# Patient Record
Sex: Female | Born: 1945 | Race: White | Hispanic: No | Marital: Single | Smoking: Former smoker
Health system: Western US, Community
[De-identification: ages and names within clinical notes are randomized; demographics above are authoritative.]

---

## 2013-05-09 ENCOUNTER — Ambulatory Visit: Admit: 2013-05-09 | Discharge: 2013-05-09 | Attending: MD

## 2013-05-09 DIAGNOSIS — M542 Cervicalgia: Secondary | ICD-10-CM

## 2013-05-09 MED ORDER — tiotropium (SPIRIVA) 18 mcg inhalation
18 | ORAL_CAPSULE | Freq: Every day | RESPIRATORY_TRACT | 2.00 refills | 30.00000 days | Status: DC
Start: 2013-05-09 — End: 2013-06-28

## 2013-05-09 MED ORDER — piroxicam (FELDENE) 20 MG capsule
20 | ORAL_CAPSULE | Freq: Every day | ORAL | 1.00 refills | Status: DC
Start: 2013-05-09 — End: 2014-08-25

## 2013-05-09 NOTE — Progress Notes (Signed)
Pt and spouse spent 5 minutes watching the TENS unit DVD while I prepared all of the necessary paperwork for them to sign.    We then spent 10 minutes going over the TENS unit and how it works.  I had the patient sign the Sales Agreement and we were done.

## 2013-05-09 NOTE — Progress Notes (Signed)
Cheryl Lowe is a 68 y.o. female, DOB March 25, 1945, who presents today for Neck Pain; Depression; Fatigue; and Hair/Scalp Problem    Patient Active Problem List   Diagnosis SNOMED CT(R)   . Allergic rhinitis ALLERGIC RHINITIS   . Candidiasis of skin and nails CANDIDIASIS OF SKIN AND NAILS   . Carpal tunnel syndrome CARPAL TUNNEL SYNDROME   . Low back pain LOW BACK PAIN   . Cervicalgia NECK PAIN   . Chronic airway obstruction CHRONIC OBSTRUCTIVE LUNG DISEASE   . 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 Depressive disorder DEPRESSIVE DISORDER   . Family history of ischemic heart disease FAMILY HISTORY OF ISCHEMIC HEART DISEASE   . Hyperlipidemia HYPERLIPIDEMIA   . 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 Osteoarthrosis OSTEOARTHRITIS   . Vaginal prolapse VAGINAL WALL PROLAPSE   . 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  Spiriva and Piroxicam refilled.  Having a problem with results of the neck x-ray.  Went over in detail.  Neck is painful ever since person giving her a facial jerked her head/neck.  Used to have a TENS unit that worked well for her hips but gave it away.  Still having a lot of fatigue that she attributes to depression.  Did not do well with a prescription antidepressant due to side effects and asks about natural remedies.  Reviewed her medications.        REVIEW OF SYSTEMS:    Review of Systems   Constitutional: Positive for fatigue (attributes to depression). Negative for fever, chills, diaphoresis, activity change, appetite change and unexpected weight change.   HENT: Negative for congestion, facial swelling, nosebleeds, rhinorrhea and sore throat.    Eyes: Negative for photophobia, pain, discharge, redness, itching and visual disturbance.   Respiratory: Positive for cough and shortness of breath. Negative  for choking, chest tightness, wheezing and stridor.         Chronic and stable on current medications.   Cardiovascular: Negative for chest pain, palpitations and leg swelling.   Gastrointestinal: Negative for abdominal pain and abdominal distention.   Endocrine: Negative for cold intolerance, heat intolerance, polydipsia, polyphagia and polyuria.   Genitourinary: Negative for difficulty urinating.   Musculoskeletal: Positive for myalgias, back pain, arthralgias and neck pain. Negative for joint swelling and gait problem.   Skin: Negative for color change, pallor, rash and wound.        Itchy scalp.   Allergic/Immunologic: Positive for environmental allergies. Negative for food allergies and immunocompromised state.   Neurological: Negative for dizziness, weakness, light-headedness and headaches.   Hematological: Negative for adenopathy. Does not bruise/bleed easily.   Psychiatric/Behavioral: Positive for dysphoric mood (as above). Negative for suicidal ideas and sleep disturbance. The patient is nervous/anxious.         Allergies   Allergen Reactions   . Bupropion Other (See Comments)     Teeth clinching and tremors   . Cefuroxime Axetil Swelling   . Cephalexin Unspecified/Patient unsure   . Penicillins Swelling       Outpatient Prescriptions Marked as Taking for the 05/09/13 encounter (Office Visit) with Earline Mayotte, MD   Medication Sig Dispense Refill   . albuterol (ACCUNEB) 0.63 mg/3 mL nebulizer solution Take 3 mL by nebulization every 6 (six) hours as needed for wheezing.       Marland Kitchen  albuterol (PROAIR HFA) 90 mcg/actuation inhaler Inhale 2 puffs into the lungs every 4 to 6 (four to six) hours as needed for shortness of breath or wheezing.       . cetirizine (ZYRTEC) 10 MG tablet Take 10 mg by mouth daily.       . clotrimazole-betamethasone (LOTRISONE) lotion Apply  topically 2 (two) times daily.       . fluticasone (FLONASE) 50 mcg/actuation nasal spray Place 1 Spray into the nostril(s) 2 (two) times daily.        . inhalational spacing device (MICROSPACER) Spcr USE AS DIRECTED WITH ALBUTEROL EVERY 4 HOURS AS NEEDED.       Marland Kitchen piroxicam (FELDENE) 20 MG capsule Take 1 capsule by mouth once daily.       . ranitidine (ZANTAC) 150 MG tablet Take 150 mg by mouth nightly.       . simvastatin (ZOCOR) 20 MG tablet Take 1 Tab by mouth once daily.       Marland Kitchen tiotropium (SPIRIVA WITH HANDIHALER) 18 mcg inhalation Inhale 1 Cap into the lungs once daily. (Two inhalations of the contents of 1 capsule once daily.)           PHYSICAL EXAM:    Filed Vitals:    05/09/13 1407   BP: 157/90   Pulse: 80   Temp: 36.4 ?C (97.6 ?F)   SpO2: 93%    Body mass index is 0.00 kg/(m^2).  Physical Exam   Constitutional: She is oriented to person, place, and time. She appears well-developed and well-nourished. No distress.   HENT:   Head: Normocephalic and atraumatic.   Musculoskeletal:        Cervical back: She exhibits decreased range of motion, tenderness and spasm (moderate paraspinous spasms and tenderness). She exhibits no deformity.   Neurological: She is alert and oriented to person, place, and time. No cranial nerve deficit.   Skin: Skin is warm and dry. No rash noted. She is not diaphoretic. No erythema. No pallor.   No rash or erythema of scalp.   Psychiatric: She has a normal mood and affect. Her behavior is normal. Thought content normal.   Nursing note and vitals reviewed.        ASSESSMENT & PLAN:  1. Cervicalgia and 2. Cervical paraspinal muscle spasm, pain is not well controlled with current medication  - Continue piroxicam  - Dispensed TENS  - Offered PT and she will think about it, but she is very concerned about having someone manipulate her neck in a way that causes more problems    3. Depressive disorder  - Recommended Vitamin D3 5000 IU daily and B complex vitamins  - She is using her mood lights but not on a regular basis.  Fortunately, the days are getting longer and the seasonal component should be improving    4. Chronic airway  obstruction, stable  - Continue current medications and inhalers    5. Scalp itch  - Given a handout on rotating shampoos  - She will let us know if not improved and we can consider Desonide lotion.    See patient instructions.      Return in about 6 weeks (around 06/20/2013) for follow up TENS, mood, energy.

## 2013-05-09 NOTE — Patient Instructions (Addendum)
Continue your current medications including the topical Salonpas.    Add in a B complex vitamin and vitamin D3 5000 IU daily.      Let me know if your scalp continues to itch and we will give you a prescription for Desonide lotion.

## 2013-05-09 NOTE — Progress Notes (Signed)
Room #4    3 month follow up. JLT

## 2013-06-28 ENCOUNTER — Ambulatory Visit: Admit: 2013-06-28 | Discharge: 2013-06-28 | Attending: MD

## 2013-06-28 DIAGNOSIS — M542 Cervicalgia: Secondary | ICD-10-CM

## 2013-06-28 MED ORDER — clotrimazole-betamethasone (LOTRISONE) lotion
1-0.05 | TOPICAL | 0.00 refills | 15.00000 days | Status: DC
Start: 2013-06-28 — End: 2014-12-25

## 2013-06-28 MED ORDER — albuterol (PROAIR HFA) 90 mcg/actuation inhaler
90 | RESPIRATORY_TRACT | Status: DC
Start: 2013-06-28 — End: 2014-04-05

## 2013-06-28 NOTE — Progress Notes (Signed)
Patient is here for a 6 week follow up to discuss tens, mood, and energy. Patient needs medication refills. Room 3. KC

## 2013-06-28 NOTE — Progress Notes (Addendum)
Cheryl Lowe is a 68 y.o. female, DOB May 11, 1945, who presents today for Follow-up    Chief Complaint   Patient presents with   . Follow-up     TENS, depression       Patient Active Problem List   Diagnosis SNOMED CT(R)   . Allergic rhinitis ALLERGIC RHINITIS   . Candidiasis of skin and nails CANDIDIASIS OF SKIN AND NAILS   . Carpal tunnel syndrome CARPAL TUNNEL SYNDROME   . Low back pain LOW BACK PAIN   . Cervicalgia NECK PAIN   . Chronic airway obstruction CHRONIC OBSTRUCTIVE LUNG DISEASE   . 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 Depressive disorder DEPRESSIVE DISORDER   . Family history of ischemic heart disease FAMILY HISTORY OF ISCHEMIC HEART DISEASE   . Hyperlipidemia HYPERLIPIDEMIA   . 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 Vaginal prolapse VAGINAL WALL PROLAPSE   . OA (osteoarthritis) OSTEOARTHRITIS   . Trochanteric bursitis of both hips TROCHANTERIC BURSITIS         History of Present Illness  HPI Comments: The TENS is phenomenal.  She is using it daily and it is helping her pain quite a bit. She is using all four electrodes and places it on the posterior neck region and across the top of her shoulders.  She is also the TENS in the lower back area on occasion.  Overall, she is using the TENS 20-30 minutes each session, 2-3 times a day.      Mood is about the same and tired a lot, vitamins haven't really perked her up (B complex with C and vitamin D3 5000 IU).      Doesn't get in to see Uro-Gyn until end of the month and is very uncomfortable due to vaginal prolapse.  No obstruction but her urinary incontinence is worse.        Review of Systems   Constitutional: Negative for fever, chills, diaphoresis, activity change, appetite change, fatigue and unexpected weight change.   Gastrointestinal: Negative for nausea, vomiting, abdominal pain, diarrhea, constipation, blood in stool, abdominal distention, anal bleeding and rectal pain.   Genitourinary:  Positive for pelvic pain (feels heavy and uncomfortable). Negative for dysuria, frequency, hematuria, flank pain and difficulty urinating.        Incontinence due to prolapse.   Musculoskeletal: Positive for myalgias, back pain, arthralgias, neck pain and neck stiffness. Negative for joint swelling and gait problem.        Improved with TENS.   Psychiatric/Behavioral: Positive for dysphoric mood (as above, feels there is a component from her urinary/bladder symptoms). Negative for suicidal ideas, hallucinations, behavioral problems, confusion, sleep disturbance, self-injury, decreased concentration and agitation. The patient is not nervous/anxious and is not hyperactive.         Allergies   Allergen Reactions   . Cephalexin Anaphylaxis   . Penicillins Anaphylaxis   . Bupropion Other (See Comments)     Teeth clinching and tremors   . Cefuroxime Axetil Swelling       Outpatient Prescriptions Marked as Taking for the 06/28/13 encounter (Office Visit) with Earline Mayotte, MD   Medication Sig Dispense Refill   . albuterol (ACCUNEB) 0.63 mg/3 mL nebulizer solution Take 3 mL by nebulization every 6 (six) hours as needed for wheezing.     Marland Kitchen albuterol (PROAIR HFA) 90 mcg/actuation inhaler Inhale 2 puffs into the lungs every 4 to 6 (four to six) hours as needed for shortness of breath  or wheezing. 17 g 3   . ASCORBIC ACID/VITAMIN E/BIOTIN (HAIR,SKIN & NAILS WITH BIOTIN ORAL) Take by mouth daily.     . cetirizine (ZYRTEC) 10 MG tablet Take 10 mg by mouth daily as needed.      . cholecalciferol, vitamin D3, (VITAMIN D3) 5,000 unit Tab Take by mouth daily.     . clotrimazole-betamethasone (LOTRISONE) lotion Apply  topically 2 (two) times daily. 30 mL 3   . fluticasone (FLONASE) 50 mcg/actuation nasal spray Place 1 Spray into the nostril(s) 2 (two) times daily.     . inhalational spacing device (MICROSPACER) Spcr USE AS DIRECTED WITH ALBUTEROL EVERY 4 HOURS AS NEEDED.     Marland Kitchen piroxicam (FELDENE) 20 MG capsule Take 1 capsule (20 mg  total) by mouth daily. 90 capsule 3   . ranitidine (ZANTAC) 150 MG tablet Take 150 mg by mouth nightly as needed.      . simvastatin (ZOCOR) 20 MG tablet Take 1 Tab by mouth once daily.     Marland Kitchen tiotropium (SPIRIVA WITH HANDIHALER) 18 mcg inhalation Inhale 1 Cap into the lungs once daily. (Two inhalations of the contents of 1 capsule once daily.)     . VITAMIN B COMPLEX & VIT C NO.4 (SUPER B COMPLEX + C ORAL) Take by mouth daily.     . [DISCONTINUED] albuterol (PROAIR HFA) 90 mcg/actuation inhaler Inhale 2 puffs into the lungs every 4 to 6 (four to six) hours as needed for shortness of breath or wheezing.     . [DISCONTINUED] clotrimazole-betamethasone (LOTRISONE) lotion Apply  topically 2 (two) times daily.         Filed Vitals:    06/28/13 1306   BP: 141/71   Pulse: 72   Temp: 36.5 ?C (97.7 ?F)   SpO2: 96%    Body mass index is 38.48 kg/(m^2).  Physical Exam   Constitutional: She is oriented to person, place, and time. She appears well-developed and well-nourished. No distress.   Neurological: She is alert and oriented to person, place, and time. No cranial nerve deficit.   Skin: She is not diaphoretic.   Psychiatric: She has a normal mood and affect. Her behavior is normal. Judgment and thought content normal.   Nursing note and vitals reviewed.        ASSESSMENT & PLAN:    1. Cervicalgia, 2. Spasm of muscle and underlying 3. Primary osteoarthritis involving multiple joints  - She will continue the TENS and current medications    4. Depressive disorder, worse in setting of symptoms from vaginal prolapse with urinary incontinence  - Continue current medications and supplements  - She is also working with hospice and their support groups as she has lost several family members and friends in the past year and is struggling with grief.    5. Vaginal prolapse  - Follow up with Uro-Gyn as scheduled.    See patient instructions.    Return in about 3 months (around 09/28/2013) for CPE, breast exam, Prevnar.

## 2013-09-27 ENCOUNTER — Ambulatory Visit: Admit: 2013-09-27 | Discharge: 2013-09-27 | Attending: MD

## 2013-09-27 DIAGNOSIS — Z23 Encounter for immunization: Secondary | ICD-10-CM

## 2013-09-27 MED ORDER — pneumococcal conjugated vaccine (PF) (PREVNAR PCV13) vaccine 0.5 mL
0.5 | Freq: Once | INTRAMUSCULAR | Status: AC
Start: 2013-09-27 — End: 2013-09-27
  Administered 2013-09-27: 22:00:00 0.5 mL via INTRAMUSCULAR

## 2013-09-27 NOTE — Progress Notes (Signed)
Cheryl Lowe is a 68 y.o. female, DOB 11-27-1945, who presents today for Follow-up; Hospital F/U; and Breast Examination      Chief Complaint   Patient presents with   . Follow-up     Chronic conditions   . Hospital F/U   . Breast Examination       Patient Active Problem List   Diagnosis SNOMED CT(R)   . Allergic rhinosinusitis ALLERGIC RHINITIS   . Candidiasis, cutaneous CANDIDIASIS OF SKIN   . Carpal tunnel syndrome, bilateral CARPAL TUNNEL SYNDROME   . Chronic low back pain CHRONIC LOW BACK PAIN   . Chronic neck pain CHRONIC NECK PAIN   . COPD (chronic obstructive pulmonary disease) CHRONIC OBSTRUCTIVE LUNG DISEASE   . DDD (degenerative disc disease) DEGENERATION OF INTERVERTEBRAL DISC   . Depression DEPRESSIVE DISORDER   . Family history of early CAD FAMILY HISTORY OF CORONARY ARTERIOSCLEROSIS   . HLD (hyperlipidemia) HYPERLIPIDEMIA   . Mixed incontinence MIXED INCONTINENCE   . 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 OA (osteoarthritis) OSTEOARTHRITIS   . Trochanteric bursitis of both hips TROCHANTERIC BURSITIS           History of Present Illness  HPI Comments: Cheryl Lowe comes in for follow up of her chronic medical conditions and a breast exam.  She has no concerns regarding her breast health.  Mammogram every two years unless abnormal exam, not due until next year.  Still recovering from vaginal prolapse surgery but doing well and as expected.   We reviewed and updated past medical history, social history, family history, review of systems and medications with changes as noted below.    Her chronic cough has significantly improved and she is congratulated for staying off cigarettes.         Review of Systems   Constitutional: Positive for fatigue (attributes to depression). Negative for fever, chills, diaphoresis, activity change, appetite change and unexpected weight change.   HENT: Negative for congestion, facial swelling, nosebleeds, rhinorrhea and sore throat.    Eyes: Negative for photophobia,  pain, discharge, redness, itching and visual disturbance.   Respiratory: Positive for cough and shortness of breath. Negative for choking, chest tightness, wheezing and stridor.         Chronic and stable on current medications.   Cardiovascular: Negative for chest pain, palpitations and leg swelling.   Gastrointestinal: Negative for nausea, vomiting, abdominal pain, diarrhea, constipation, blood in stool, abdominal distention, anal bleeding and rectal pain.   Endocrine: Negative for cold intolerance, heat intolerance, polydipsia, polyphagia and polyuria.   Genitourinary: Negative for dysuria and pelvic pain.        Recent surgery and is still recovering.  She continues to have some leakage of urine but is much improved from prior to her surgery.   Musculoskeletal: Positive for myalgias, back pain, arthralgias and neck pain. Negative for joint swelling and gait problem.   Skin: Negative for color change, pallor, rash and wound.        Itchy scalp.   Allergic/Immunologic: Positive for environmental allergies. Negative for food allergies and immunocompromised state.   Neurological: Negative for dizziness, tremors, seizures, syncope, facial asymmetry, speech difficulty, weakness, light-headedness, numbness and headaches.   Hematological: Negative for adenopathy. Does not bruise/bleed easily.   Psychiatric/Behavioral: Positive for dysphoric mood (improved). Negative for suicidal ideas, hallucinations, behavioral problems, confusion, sleep disturbance, self-injury, decreased concentration and agitation. The patient is nervous/anxious. The patient is not hyperactive.         Past Medical  History   Diagnosis Date   . Trochanteric bursitis of both hips    . Allergic rhinosinusitis    . Depression    . HLD (hyperlipidemia)    . Tobacco abuse    . Obesity (BMI 30.0-34.9)    . Bilateral carpal tunnel syndrome    . Leaking of urine    . COPD (chronic obstructive pulmonary disease)    . Mixed incontinence    . Lumbago      2/2  DDD   . Chronic neck pain      2/2 DDD   . OA (osteoarthritis)        Past Surgical History   Procedure Laterality Date   . Total abdominal hysterectomy  1974     Retained ovaries, performed due to prolapse   . Repair rectocele  1998   . Cystocele repair  1998   . Shoulder surgery Right 1982     Removal of calcium deposits   . Cervical laminectomy  2005     [place], [place]   . Cervical fusion  2005     [place], [place]   . Sacropexy and urethal sling  08/05/2013     [place]       History     Social History   . Marital Status: Single     Spouse Name: N/A     Number of Children: 2   . Years of Education: N/A     Occupational History   . Psychiatrist - Retired      Formerly worked for [employer] History Main Topics   . Smoking status: Former Smoker -- 1.00 packs/day for 45 years     Types: Cigarettes     Quit date: 01/20/2012   . Smokeless tobacco: Never Used      Comment: Trying to quit   . Alcohol Use: 2.4 - 4.8 oz/week     4-8 Glasses of wine per week      Comment: 1-2 glasses red wine four nights a week   . Drug Use: No   . Sexual Activity: No     Other Topics Concern   . Not on file     Social History Narrative       Family History   Problem Relation Age of Onset   . High Cholestrol Mother    . Hypertension Mother    . Hypothyroidism Mother    . Heart attack Father 25   . Bone cancer Maternal Uncle    . Cancer Maternal Aunt      Face and neck   . Stroke Father      Cerebral hemorrhage   . Leukemia Sister    . Alcohol abuse Father      /Drug abuse   . Diabetes Brother    . Diabetes Maternal Uncle    . Hypertension Brother    . Hypertension Father    . High Cholestrol Father    . High Cholestrol Brother        Family Status   Relation Status Death Age   . Father Deceased 88     Cerebral hemorrhage   . Mother Alive    . Sister Deceased 64     Leukemia   . Brother Alive    . Sister Alive    . Son Alive    . Son Alive        Allergies   Allergen Reactions   . Keflex [Cephalexin]  Anaphylaxis   .  Penicillins Anaphylaxis and Swelling   . Ceftin [Cefuroxime Axetil] Swelling   . Wellbutrin [Bupropion Hcl] Other (See Comments)     Teeth clenching and tremors       Outpatient Prescriptions Marked as Taking for the 09/27/13 encounter (Office Visit) with Earline Mayotte, MD   Medication Sig Dispense Refill   . albuterol (ACCUNEB) 0.63 mg/3 mL nebulizer solution Take 3 mL by nebulization every 6 (six) hours as needed for wheezing.     Marland Kitchen albuterol (PROAIR HFA) 90 mcg/actuation inhaler Inhale 2 puffs into the lungs every 4 to 6 (four to six) hours as needed for shortness of breath or wheezing. 17 g 3   . cetirizine (ZYRTEC) 10 MG tablet Take 10 mg by mouth daily as needed.      . cholecalciferol, vitamin D3, (VITAMIN D3) 5,000 unit Tab Take by mouth daily.     . clotrimazole-betamethasone (LOTRISONE) lotion Apply  topically 2 (two) times daily. 30 mL 3   . fluticasone (FLONASE) 50 mcg/actuation nasal spray Place 1 Spray into the nostril(s) 2 (two) times daily.     . inhalational spacing device (MICROSPACER) Spcr USE AS DIRECTED WITH ALBUTEROL EVERY 4 HOURS AS NEEDED.     Marland Kitchen piroxicam (FELDENE) 20 MG capsule Take 1 capsule (20 mg total) by mouth daily. 90 capsule 3   . ranitidine (ZANTAC) 150 MG tablet Take 150 mg by mouth nightly as needed.      . simvastatin (ZOCOR) 20 MG tablet Take 1 Tab by mouth once daily.     Marland Kitchen tiotropium (SPIRIVA WITH HANDIHALER) 18 mcg inhalation Inhale 1 Cap into the lungs once daily. (Two inhalations of the contents of 1 capsule once daily.)     . VITAMIN B COMPLEX & VIT C NO.4 (SUPER B COMPLEX + C ORAL) Take by mouth daily.     . [DISCONTINUED] ASCORBIC ACID/VITAMIN E/BIOTIN (HAIR,SKIN & NAILS WITH BIOTIN ORAL) Take by mouth daily.         Filed Vitals:    09/27/13 1413   BP: 137/78   Pulse: 76   Temp: 36.6 ?C (97.9 ?F)   Resp: 20   SpO2: 95%    Body mass index is 36.77 kg/(m^2).  Physical Exam   Constitutional: She is oriented to person, place, and time. She appears well-developed and  well-nourished. No distress.   HENT:   Head: Normocephalic and atraumatic.   Mouth/Throat: Oropharynx is clear and moist. No oropharyngeal exudate.   Eyes: Conjunctivae and EOM are normal. Pupils are equal, round, and reactive to light. Right eye exhibits no discharge. Left eye exhibits no discharge. No scleral icterus.   Neck: Normal range of motion. Neck supple. No tracheal deviation present. No thyromegaly present.   Cardiovascular: Normal rate, regular rhythm, normal heart sounds and intact distal pulses.  Exam reveals no gallop and no friction rub.    No murmur heard.  Pulmonary/Chest: Effort normal and breath sounds normal. No stridor. No respiratory distress. She has no wheezes. She has no rales. Right breast exhibits no inverted nipple, no mass, no nipple discharge, no skin change and no tenderness. Left breast exhibits no inverted nipple, no mass, no nipple discharge, no skin change and no tenderness. Breasts are symmetrical.   Abdominal: Soft. Bowel sounds are normal. She exhibits distension (a little bloated, normal for post op status). There is tenderness (mild and diffuse). There is no rebound and no guarding.   Well healed sites of robotic surgery ports.  Musculoskeletal: Normal range of motion. She exhibits edema (trace). She exhibits no tenderness.   Lymphadenopathy:        Head (right side): No submental, no submandibular, no preauricular and no posterior auricular adenopathy present.        Head (left side): No submental, no submandibular, no preauricular and no posterior auricular adenopathy present.     She has no cervical adenopathy.     She has no axillary adenopathy.        Right axillary: No pectoral and no lateral adenopathy present.        Left axillary: No pectoral and no lateral adenopathy present.       Right: No supraclavicular adenopathy present.        Left: No supraclavicular adenopathy present.   Neurological: She is alert and oriented to person, place, and time. She has normal  reflexes. She displays normal reflexes. No cranial nerve deficit. She exhibits normal muscle tone. Coordination normal.   Skin: Skin is warm and dry. No rash noted. She is not diaphoretic. No erythema. No pallor.   Psychiatric: She has a normal mood and affect. Her behavior is normal. Judgment and thought content normal.   Nursing note and vitals reviewed.        ASSESSMENT & PLAN:    1. Vaginal prolapse and 2. Mixed incontinence, recovering well from robotic surgical repair  - She will follow up with Dr. Delbert Harness are scheduled    3. COPD (chronic obstructive pulmonary disease) with underlying 4. Allergic rhinitis due to animal hair and dander, significantly improved from last year  - Continue current medications and inhalers    5. Breast cancer screening, normal exam   - Mammogram next year    6. Need for pneumococcal vaccination  - Prevnar-13 given today    See patient instructions.    Return in about 6 months (around 03/28/2014) for follow up.    30 minutes of face to face time was spent with the patient, greater than half of this time was spent counseling the patient regarding post surgical course and management of her COPD.

## 2013-09-27 NOTE — Progress Notes (Signed)
CPE, Breast exam & Prevnar.    RM 3

## 2013-11-07 ENCOUNTER — Institutional Professional Consult (permissible substitution): Admit: 2013-11-07 | Discharge: 2013-11-07

## 2013-11-07 DIAGNOSIS — Z23 Encounter for immunization: Secondary | ICD-10-CM

## 2013-11-07 MED ORDER — flu vaccine qs 2015-16 (36mos+) (PF) 0.5 mL
60 | Freq: Once | INTRAMUSCULAR | Status: AC
Start: 2013-11-07 — End: 2013-11-07
  Administered 2013-11-07: 17:00:00 60 mL via INTRAMUSCULAR

## 2013-11-07 NOTE — Progress Notes (Signed)
Are you allergic to eggs?  No  Do you have a cold, fever, or acute illness?  No  Have you ever had Guilan-Barre syndrome?  No  Have you received a vaccine in the last 30 days?  No    Adverse reaction to this flu shot? No    Standing Paper Order on file as of 10/27/13, at [facility].   JLT

## 2013-11-07 NOTE — Telephone Encounter (Signed)
When pt was in for her flu shot this morning she stopped at my desk and said she's been having some eye issues.  Feels like her eye lids are impacting mostly overnight to her lower lids.  But she also said her eye sight has been very blurry lately and eyes are watering all the time.  Not sure if she needs to be seen or if there's something else she could try.

## 2013-11-08 NOTE — Telephone Encounter (Signed)
I would start with warm compresses at night time and ketotifen eye drops (sounds like eye allergies).  Do they burn or become red?

## 2013-11-08 NOTE — Telephone Encounter (Signed)
Left detailed message.   

## 2014-02-13 NOTE — Telephone Encounter (Signed)
Patient states that she hurt her neck right after Christmas and went to immediate care. They gave her muscle relaxers, but it is still bothering her. She would like to get an appointment with Dr. Annabelle Harman. bes

## 2014-02-13 NOTE — Telephone Encounter (Signed)
Lets get her in with Dennie Bible, Dr Annabelle Harman doesn't have anything right now.

## 2014-02-13 NOTE — Telephone Encounter (Signed)
I contacted the patient and have her scheduled to see Dennie Bible, FNP. SDD    Future Appointments  Date Time Provider Department Center   02/14/2014 11:10 AM Raymond Gurney, NP [facility]   03/29/2014 10:20 AM Earline Mayotte, MD [facility]

## 2014-02-14 ENCOUNTER — Ambulatory Visit: Admit: 2014-02-14 | Discharge: 2014-02-14 | Attending: Family

## 2014-02-14 DIAGNOSIS — M542 Cervicalgia: Secondary | ICD-10-CM

## 2014-02-14 MED ORDER — baclofen (LIORESAL) 10 MG tablet
10 | ORAL_TABLET | Freq: Three times a day (TID) | ORAL | 1.00 refills | 30.00000 days | Status: DC
Start: 2014-02-14 — End: 2014-02-14

## 2014-02-14 MED ORDER — baclofen (LIORESAL) 10 MG tablet
10 | ORAL_TABLET | ORAL | 1.00 refills | 30.00000 days | Status: DC
Start: 2014-02-14 — End: 2014-04-05

## 2014-02-14 NOTE — Patient Instructions (Addendum)
When in donut hole ask for patient assistance    Piroxicam until neck heals  Ice as anti-inflammatory if flares  Muscle relaxer to sleep    Ask for copy of neck x-ray done before this system in  Place    Lab Orders  Office use only:  Date: 02/14/2014  Time: 11:50 AM  User Initials: SDD     Additional Info:   Patient Information:  [x]  Fasting []  Non-fasting lab orders.  When?: Anytime    (Fast for 10-12 hours, may have water and usually medications only)    Referral Instructions (Pending)  Office use only:  Date: 02/14/2014  Time: 11:51 AM  User Initials: SDD     Additional Info:   Pending referral for Physical Therapy.    []  Please call to schedule an appointment.  []  We will call you with a scheduled appointment.   [x] They will call you to schedule an appointment.    [x]  We will send pertinent records  []  No records needed

## 2014-02-14 NOTE — Progress Notes (Signed)
Please see the patient instructions section of the chart.

## 2014-02-14 NOTE — Progress Notes (Signed)
Cheryl Lowe is a 69 y.o. female, DOB 1945/08/28, who presents today for Neck Pain  .      History of Present Illness  HPI Comments: said not taking meds but leave them on as she might want to start again  Started spireva for COPD  Thinks doing well without it  Never wheezes, albuterol only in summer  Just quit everything because in donut hole due to cost of spireva  Piroxicam didn't notice difference    Neck issue  Did a stupid thing she lifted acanning pot that was full and lifted up into frig it was that heavy didn't hurt until next day  Hurt down sides of neck can't turn and can't look down  Sleep painful  4 days after xmas  [facility] jan 1st just gave muscle relaxer because woke so swollen didn't have chin and or neck got antibiotics from sinus, couldn't blow nose antibiotics got rid of swelling and sinus but didn't change the pain  Muscle relaxer calmed things down then did serving commmuity soup 5 days later  Hasn't tried piroxicam or muscle relaxer again  Ice and heat releives for a moment  Has metal in neck got a facial and neck was twisted hard x-rays with cadaver and found has metal in neck still  Repaired C5     Hasn't had blood in long time  Not taking lipid meds  Wants check for vitamins etc too.   Depression doing OK without meds    Neck Pain            Review of Systems   Constitutional: Positive for activity change.        Neck hurts limits movements   HENT:        See HPI had swelling and sinus problem Jan 1 got antibiotics resolved now but neck still a bit tender to touch in the front   Respiratory: Positive for wheezing. Negative for apnea, cough, shortness of breath and stridor.         Denies resp problems, known COPD but meds didn't help and were expensive   Gastrointestinal:        On simvastatin but not taking lately   Musculoskeletal: Positive for neck pain.        See HPI   Allergic/Immunologic: Positive for environmental allergies.   Neurological: Negative.       Psychiatric/Behavioral:        Hx depression not on meds and doing OK          Filed Vitals:    02/14/14 1121   BP: 139/73   Pulse: 75   Resp: 12   SpO2: 93%    There is no weight on file to calculate BMI.  Physical Exam   Constitutional: She is oriented to person, place, and time. She appears well-developed and well-nourished. No distress.   HENT:   Head: Normocephalic.   Right Ear: External ear normal.   Left Ear: External ear normal.   Nose: Nose normal.   Mouth/Throat: Oropharynx is clear and moist. No oropharyngeal exudate.   Eyes: EOM are normal. Pupils are equal, round, and reactive to light.   Neck: Normal range of motion. Neck supple. No thyromegaly present.   Tender to palpation of thyroid and anterior neck muscles   Cardiovascular: Normal rate and regular rhythm.    Pulmonary/Chest: Effort normal and breath sounds normal. No respiratory distress. She has no wheezes.   Musculoskeletal: She exhibits tenderness.  Point tender over the SCM bilaterally and in the trapezius on right, and down the right side of spine.  Resisted pushing and can pour hurt right side of neck and back. Head ROM limited side to side about 30 degrees, chin down to 30 degrees.   Lymphadenopathy:     She has no cervical adenopathy.   Neurological: She is alert and oriented to person, place, and time. No cranial nerve deficit.   Skin: Skin is warm and dry.   Psychiatric: She has a normal mood and affect. Her behavior is normal. Judgment and thought content normal.   Pleasant jovial laughing   Nursing note and vitals reviewed.        ASSESSMENT & PLAN:       ICD-9-CM ICD-10-CM    1. Muscle spasms of head and/or neck 728.85 M62.48 Referral to Physical Medicine Rehab      baclofen (LIORESAL) 10 MG tablet      Comprehensive Metabolic Panel -Routine   2. Vitamin D deficiency disease 268.9 E55.9 25-Hydroxyvitamin D2 And D3, Serum -Routine   3. COPD (chronic obstructive pulmonary disease) 496 J44.9 Comprehensive Metabolic Panel -Routine       CBC with Auto Differential -Routine   4. HLD (hyperlipidemia) 272.4 E78.5 Comprehensive Metabolic Panel -Routine      Lipid Panel with Direct LDL -Routine   5. Depression 311 F32.9 Thyroid Stimulating Hormone -Routine     When in donut hole ask for patient assistance    Piroxicam until neck heals  Ice as anti-inflammatory if flares  Muscle relaxer to sleep    Ask for copy of neck x-ray done before this system in  Place

## 2014-02-16 NOTE — Progress Notes (Signed)
Agree with assessment and plan of care

## 2014-03-01 ENCOUNTER — Other Ambulatory Visit: Admit: 2014-03-01

## 2014-03-01 DIAGNOSIS — M62838 Other muscle spasm: Secondary | ICD-10-CM

## 2014-03-01 LAB — LIPID PANEL WITH DIRECT LDL
Chol/HDL Ratio: 3.7 (ref 0.0–5.0)
Cholesterol, HDL: 63 mg/dL (ref >=60)
Cholesterol: 235 mg/dL — ABNORMAL HIGH (ref <=200)
Direct LDL: 158 mg/dL — ABNORMAL HIGH (ref 0.0–129.0)
Triglyceride: 96 mg/dL (ref <=150.0)
VLDL Cholesterol Calculation: 19.2 mg/dL (ref 7.0–32.0)

## 2014-03-01 LAB — COMPREHENSIVE METABOLIC PANEL
ALT - Alanine Aminotransferase: 25 U/L (ref 5–33)
AST - Aspartate Aminotransferase: 21 U/L (ref 5–32)
Albumin/Globulin Ratio: 1.3 (ref 1.2–2.2)
Albumin: 4.1 g/dL (ref 3.5–5.0)
Alkaline Phosphatase: 70 U/L (ref 35–105)
Anion Gap: 19 mmol/L — ABNORMAL HIGH (ref 5.0–15.0)
BUN / Creatinine Ratio: 26.6 — ABNORMAL HIGH (ref 12.0–20.0)
BUN: 17 mg/dL (ref 8–20)
Bilirubin Total: 0.9 mg/dL (ref 0.10–1.70)
CO2 - Carbon Dioxide: 23 mmol/L (ref 22–32)
Calcium: 9.3 mg/dL (ref 8.6–10.6)
Chloride: 104.6 mmol/L (ref 101.0–111.0)
Creatinine: 0.64 mg/dL (ref 0.60–1.30)
Glomerular Filtration Rate Estimate: >60 mL/min/{1.73_m2} (ref >=60.0)
Glucose: 104 mg/dL (ref 74–106)
Osmolality Calculation: 285 mOsm/kg (ref 275.0–300.0)
Potassium: 4.21 mmol/L (ref 3.50–5.10)
Protein Total: 7.2 g/dL (ref 6.1–7.9)
Sodium: 142 mmol/L (ref 135–145)

## 2014-03-01 LAB — CBC WITH AUTO DIFFERENTIAL
Basophils %: 1 % (ref 0–2)
Basophils, Absolute: 0 10*3/??L (ref 0.0–0.1)
Eosinophils %: 4 % (ref 0–5)
Eosinophils, Absolute: 0.2 10*3/??L (ref 0.0–0.4)
HCT: 44.6 % (ref 35.0–48.0)
Hemoglobin: 14.8 g/dL (ref 11.7–16.5)
Lymphocytes %: 25 % (ref 20–40)
Lymphocytes, Absolute: 1.5 10*3/??L (ref 1.0–4.0)
MCH: 32.2 pg (ref 28.3–33.3)
MCHC: 33.2 g/dL (ref 32.5–36.0)
MCV: 97 fL (ref 81.0–100.0)
MPV: 8.4 fL (ref 6.9–10.0)
Monocytes %: 7 % (ref 2–12)
Monocytes, Absolute: 0.4 10*3/??L (ref 0.2–1.0)
Neutrophils %: 64 % (ref 50–74)
Neutrophils, Absolute: 3.8 10*3/??L (ref 2.0–7.4)
Platelet Count: 209 10*3/??L (ref 150–405)
RBC: 4.6 10*6/??L (ref 3.80–5.60)
RDW: 13.5 % (ref 11.7–16.1)
WBC: 5.9 10*3/??L (ref 4.50–11.00)

## 2014-03-01 LAB — TSH: TSH - Thyroid Stimulating Hormone: 1.5 u[IU]/mL (ref 0.34–5.60)

## 2014-03-01 LAB — 25-HYDROXYVITAMIN D, LC/MS/MS (MAYO)
25-Hydroxy D Total: 30 ng/mL
25-Hydroxy D2: <4 ng/mL
25-Hydroxy D3: 30 ng/mL

## 2014-03-01 NOTE — Telephone Encounter (Signed)
I'll prescribe a Medrol dose pack and Z-pack but she should only take if she has symptoms.

## 2014-03-01 NOTE — Telephone Encounter (Signed)
Pt came in to let us know that she will be taking a flight to see her sister on 03/11/2014.   Pt is requesting that Dr. Annabelle Harman prescribe her Prednisone and antibiotics as she has lung issues and the recycled air on the plane is hard on her bad lungs.  Advised I would get a note back with the request for further review.  Pt can be reached at [phone] if there are any additional questions.   BJG

## 2014-03-02 MED ORDER — methylPREDNISolone (MEDROL DOSEPACK) 4 mg tablet
4 | ORAL_TABLET | ORAL | Status: DC
Start: 2014-03-02 — End: 2014-10-11

## 2014-03-02 MED ORDER — azithromycin (ZITHROMAX) 250 MG tablet
250 | ORAL_TABLET | Freq: Every day | ORAL | 0.00 refills | 5.00000 days | Status: DC
Start: 2014-03-02 — End: 2014-10-11

## 2014-03-02 NOTE — Telephone Encounter (Signed)
Left message for pt that rx's were ordered as requested for trip.

## 2014-03-10 NOTE — Telephone Encounter (Signed)
From: [name]  To: Earline Mayotte, MD  Sent: 03/09/2014 5:23 PM PST  Subject: Non-Urgent Medical Question    HI guys!!! I'm trying to find out what blood type I am. Any idea where I can find this information in MyChart???? Or can you tell me......?    Thanks bunches,  L/B

## 2014-03-29 ENCOUNTER — Encounter: Attending: MD

## 2014-04-03 NOTE — Telephone Encounter (Signed)
Pt called, she is needing to reschedule the 03/29/14 appointment that she missed due to being out of town.  I advised I would get a note back to Dr. Marland Kitchen staff to review future availability.     BJG

## 2014-04-03 NOTE — Telephone Encounter (Signed)
I contacted the patient and have her rescheduled with Dr. Annabelle Harman. While on the phone with the patient she said that [facility] just called her to get her scheduled and the referral was sent to them in January. She would like to have a referral sent to someone else for her physical therapy. She said that it is pretty ridiculous that they are just now contacting her to schedule an appointment. SDD    Future Appointments  Date Time Provider Department Center   04/05/2014 1:40 PM Earline Mayotte, MD [facility]

## 2014-04-03 NOTE — Telephone Encounter (Signed)
Please call to r/s

## 2014-04-03 NOTE — Telephone Encounter (Signed)
Pt is coming in on the 9th, we can discuss PT referral then.

## 2014-04-05 ENCOUNTER — Ambulatory Visit: Admit: 2014-04-05 | Discharge: 2014-04-05 | Attending: MD

## 2014-04-05 ENCOUNTER — Inpatient Hospital Stay: Admit: 2014-04-05 | Attending: MD

## 2014-04-05 DIAGNOSIS — M5031 Other cervical disc degeneration,  high cervical region: Secondary | ICD-10-CM

## 2014-04-05 DIAGNOSIS — M62838 Other muscle spasm: Secondary | ICD-10-CM

## 2014-04-05 MED ORDER — albuterol (PROVENTIL HFA;VENTOLIN HFA) 90 mcg/actuation inhaler
90 | RESPIRATORY_TRACT | 2.00 refills | 17.00000 days | Status: DC | PRN
Start: 2014-04-05 — End: 2014-09-21

## 2014-04-05 MED ORDER — albuterol (ACCUNEB) 0.63 mg/3 mL nebulizer solution
0.63 | RESPIRATORY_TRACT | Status: DC | PRN
Start: 2014-04-05 — End: 2014-04-06

## 2014-04-05 NOTE — Patient Instructions (Addendum)
X-Ray  Office use only:  Date: 04/05/2014  Time: 2:34 PM  User Initials: SDD     Additional Info:     Scheduling Information:  No appointment needed. Please walk into [facility] or [facility] at your convenience.      Procedure Instructions:  No special instructions.    Referral Instructions (Pending)  Office use only:  Date: 04/05/2014  Time: 2:35 PM  User Initials: SDD     Additional Info:   Pending referral for Physical Therapy.    []  Please call to schedule an appointment.  []  We will call you with a scheduled appointment.   [x] They will call you to schedule an appointment.    [x]  We will send pertinent records  []  No records needed    REMINDER: PLEASE BRING THIS LIST OF MEDICATIONS WITH YOU TO YOUR NEXT APPOINTMENT.   Please review the medications on this sheet with the medications you have at home. If you find a discrepancy or there are changes between now and your next appointment please make note on the after visit summary.     As a reminder our office requires you to arrive at the time of or before your scheduled appointment, failure to due so may result in a reschedule of your appointment.         Script for Albuterol Faxed to [facility] on 04/05/14

## 2014-04-05 NOTE — Progress Notes (Signed)
Cheryl Lowe is a 69 y.o. female, DOB 03/19/1945, who presents today for No chief complaint on file.  Marland Kitchen      History of Present Illness  HPI Comments: No recent pulmonary symptoms, except did some plane travel recently and has a slight productive cough.  She continues her inhalers.  Having a lot of neck pain with popping and cracking sensation with movements.  Occasionally will radiate into arms but no significant weakness or paresthesias.  Chronic low back pain but seems to be worse lately because she is doing some lifting and packing.  Requests PT and this is certainly reasonable and has helped her in the past.  Planning to move to [place] when she sells her house.           Review of Systems   Constitutional: Negative for fever, chills, diaphoresis, activity change, appetite change, fatigue and unexpected weight change.   HENT: Negative for congestion and sinus pressure.    Respiratory: Positive for cough. Negative for apnea, choking, chest tightness, shortness of breath, wheezing and stridor.    Cardiovascular: Negative for chest pain and leg swelling.   Musculoskeletal: Positive for myalgias, back pain, arthralgias, neck pain and neck stiffness. Negative for joint swelling and gait problem.        See HPI   Allergic/Immunologic: Positive for environmental allergies.   Neurological: Negative.    Psychiatric/Behavioral:        Hx depression not on meds and doing OK        Allergies   Allergen Reactions   . Keflex [Cephalexin] Anaphylaxis   . Penicillins Anaphylaxis and Swelling   . Ceftin [Cefuroxime Axetil] Swelling   . Wellbutrin [Bupropion Hcl] Other (See Comments)     Teeth clenching and tremors       Outpatient Prescriptions Marked as Taking for the 04/05/14 encounter (Office Visit) with Earline Mayotte, MD   Medication Sig Dispense Refill   . cetirizine (ZYRTEC) 10 MG tablet Take 10 mg by mouth daily as needed.      . cholecalciferol, vitamin D3, (VITAMIN D3) 5,000 unit Tab Take by mouth daily.     .  clotrimazole-betamethasone (LOTRISONE) lotion Apply  topically 2 (two) times daily. 30 mL 3   . fluticasone (FLONASE) 50 mcg/actuation nasal spray Place 1 Spray into the nostril(s) 2 (two) times daily.     . inhalational spacing device (MICROSPACER) Spcr USE AS DIRECTED WITH ALBUTEROL EVERY 4 HOURS AS NEEDED.     . ranitidine (ZANTAC) 150 MG tablet Take 150 mg by mouth nightly as needed.      . [DISCONTINUED] albuterol (ACCUNEB) 0.63 mg/3 mL nebulizer solution Take 3 mL by nebulization every 6 (six) hours as needed for wheezing.     . [DISCONTINUED] albuterol (ACCUNEB) 0.63 mg/3 mL nebulizer solution Take 3 mLs (0.63 mg total) by nebulization every 4 (four) hours as needed for Wheezing. Take 3 mL by nebulization every 6 (six) hours as needed for wheezing. 30 vial 2   . [DISCONTINUED] albuterol (PROAIR HFA) 90 mcg/actuation inhaler Inhale 2 puffs into the lungs every 4 to 6 (four to six) hours as needed for shortness of breath or wheezing. 17 g 3       Filed Vitals:    04/05/14 1338   BP: 135/83   Pulse: 76   Temp: 36.5 ?C (97.7 ?F)   Resp: 14   SpO2: 91%    Body mass index is 37.21 kg/(m^2).  Physical Exam   Constitutional: She  is oriented to person, place, and time. She appears well-developed and well-nourished. No distress.   Neck:   Reduced ROM.   Cardiovascular: Normal rate, regular rhythm and normal heart sounds.    Pulmonary/Chest: Effort normal. No respiratory distress. She has no wheezes. She has no rales.   Clear but hyperinflated lung fields.   Neurological: She is alert and oriented to person, place, and time. No cranial nerve deficit.   Skin: She is not diaphoretic.   Psychiatric: She has a normal mood and affect. Her behavior is normal. Judgment and thought content normal.   Nursing note and vitals reviewed.        ASSESSMENT & PLAN:       1. Muscle spasms of head and/or neck, 3. 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 Left-sided low back pain with left-sided sciatica  - Referral to Physical Therapy (EXT)    4. Mucopurulent chronic bronchitis  - Continue inhalers and management of her allergies    See patient instructions.    Return in about 6 months (around 10/06/2014) for CPE, breast.

## 2014-04-05 NOTE — Progress Notes (Signed)
Cheryl Lowe is a 69 y.o. female is here for 6 month follow up. RM 4

## 2014-04-05 NOTE — Progress Notes (Signed)
Please see the patient instructions section of the chart.

## 2014-04-06 NOTE — Telephone Encounter (Signed)
I fixed it and resent the Rx.

## 2014-04-06 NOTE — Telephone Encounter (Signed)
Incoming fax from [facility] regarding  Albuterol:    Please clarify sig- every 4 hours or every 6 hours? [facility]     Form to MA to Process

## 2014-04-06 NOTE — Addendum Note (Signed)
Addended by: Willette Brace on: 04/06/2014 07:18 PM     Modules accepted: Orders

## 2014-04-06 NOTE — Telephone Encounter (Signed)
There's two SIGS on the nebulizer med.

## 2014-04-07 MED ORDER — albuterol (ACCUNEB) 0.63 mg/3 mL nebulizer solution
0.63 | RESPIRATORY_TRACT | 2.00 refills | 17.00000 days | Status: DC | PRN
Start: 2014-04-07 — End: 2014-04-07

## 2014-04-07 MED ORDER — albuterol (ACCUNEB) 0.63 mg/3 mL nebulizer solution
0.63 | RESPIRATORY_TRACT | 2.00 refills | 17.00000 days | Status: DC | PRN
Start: 2014-04-07 — End: 2014-04-18

## 2014-04-07 NOTE — Addendum Note (Signed)
Addended by: Laveda Abbe on: 04/07/2014 08:19 AM     Modules accepted: Orders

## 2014-04-07 NOTE — Telephone Encounter (Signed)
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

## 2014-04-07 NOTE — Telephone Encounter (Signed)
Pt advised of cervical xrays.  She was under the impression that she was going to be having some low back xrays done too?      Pt also states she did the [employer] and sleeping on left side last night and couldn't hardly get out of bed or walk this morning.

## 2014-04-09 NOTE — Telephone Encounter (Signed)
We only talked about the neck when she was in the office, but I will order low back x-rays as well.

## 2014-04-09 NOTE — Addendum Note (Signed)
Addended by: Willette Brace on: 04/09/2014 09:28 PM     Modules accepted: Orders

## 2014-04-11 ENCOUNTER — Encounter

## 2014-04-18 MED ORDER — albuterol (ACCUNEB) 0.63 mg/3 mL nebulizer solution
0.63 | RESPIRATORY_TRACT | 2.00 refills | 17.00000 days | Status: DC | PRN
Start: 2014-04-18 — End: 2015-05-07

## 2014-04-18 NOTE — Telephone Encounter (Signed)
Was not a duplicate for Albuterol; this request was for the Nebulizer solution; not inhaler.

## 2014-04-18 NOTE — Telephone Encounter (Signed)
Duplicate received from [facility] for Albuterol.   Filled: 04/07/2014 Refills: 2    Pharmacy Note:    Form to MA to Process

## 2014-04-18 NOTE — Telephone Encounter (Signed)
Pt called, she stated that [facility] has submitted a fax to get a copy of the Xray of her neck; she has asked if we can fax that over to them ASAP so that she can maybe begin treatment with them today.     I advised that I would get a note back to see if we are able to accommodate that request for her.    BJG

## 2014-04-18 NOTE — Addendum Note (Signed)
Addended by: Laveda Abbe on: 04/18/2014 10:51 AM     Modules accepted: Orders

## 2014-04-20 NOTE — Telephone Encounter (Signed)
I received a call from [facility], we have not received a fax on this request.  However I did just fax the C spine Xray to them.

## 2014-05-18 MED ORDER — azithromycin (ZITHROMAX) 250 MG tablet
250 | ORAL_TABLET | Freq: Every day | ORAL | Status: AC
Start: 2014-05-18 — End: 2014-05-23

## 2014-05-18 MED ORDER — methylPREDNISolone (MEDROL DOSEPACK) 4 mg tablet
4 | ORAL_TABLET | ORAL | Status: AC
Start: 2014-05-18 — End: 2014-05-25

## 2014-05-18 NOTE — Telephone Encounter (Signed)
Pt advised.

## 2014-05-18 NOTE — Telephone Encounter (Signed)
Pt called stating she is in need of antibiotics called in and prednisone. She states it started yesterday and it went right to her lungs. She woke up with a sore throat and conjestion and she is using her nebulizer. She does have surgery in a week and she is trying to be prepared for this.   Please advise  JMR   CB [phone]

## 2014-05-18 NOTE — Telephone Encounter (Signed)
Done

## 2014-08-22 NOTE — Telephone Encounter (Signed)
Patients called and stated she needs to make an appointment as her eyes are out of control, burning, watering and they are red,  She has eliminated fans and allergies. Patient stated she has been taking allergies medication was well and it has not made any difference.  She stated she would also be willing to see RIck. Please advise. JLG     Future Appointments  Date Time Provider Department Center   10/11/2014 10:20 AM Earline Mayotte, MD [facility]

## 2014-08-22 NOTE — Telephone Encounter (Signed)
Contacted & scheduled   BJG    Future Appointments  Date Time Provider Department Center   08/23/2014 8:10 AM [facility]   10/11/2014 10:20 AM Earline Mayotte, MD [facility]

## 2014-08-22 NOTE — Telephone Encounter (Signed)
Please schedule with Rick, FNP.

## 2014-08-23 ENCOUNTER — Ambulatory Visit: Admit: 2014-08-23 | Discharge: 2014-08-23 | Attending: Family

## 2014-08-23 DIAGNOSIS — J302 Other seasonal allergic rhinitis: Secondary | ICD-10-CM

## 2014-08-23 MED ORDER — olopatadine (PATANOL) 0.1 % ophthalmic solution
0.1 | Freq: Two times a day (BID) | OPHTHALMIC | 2.00 refills | Status: DC
Start: 2014-08-23 — End: 2014-08-23

## 2014-08-23 MED ORDER — ketotifen (ZYRTEC ITCHY EYE DROPS, KETO,) 0.025 % (0.035 %) ophthalmic solution
0.025 | Freq: Three times a day (TID) | OPHTHALMIC | 0.00 refills | Status: DC
Start: 2014-08-23 — End: 2015-03-27

## 2014-08-23 NOTE — Progress Notes (Signed)
Roshanna Cimino is a 69 y.o. female, DOB 07/11/1945, who presents today for eyes tearing    Ms. Ruppe is here for a 2-week history of tearing and sensitive eyes.      This is a 69 year old female patient of Elon Jester C. Annabelle Harman, MD, who has a history of allergic rhinosinusitis, chronic back pain, COPD, hyperlipidemia and GERD.    She has no new medications, facials, soaps or other known environmental irritants.  [place] any new blooming flora.  The symptoms are present both at home and in trips to [place].  She denies any rhinorrhea or change in vision.  She has a mild cough, but this is unchanged due to her COPD that is relatively well controlled on p.r.n. albuterol and Spiriva.    PHYSICAL EXAMINATION:  GENERAL:  She is alert and oriented.  HEENT:  Negative.  There is a mild injection of the conjunctivae and scant vessels in the sclera.  There is no evidence of hordeolum or abrasions.  NECK:  There are no neck nodes.  LUNGS:  Clear except for some scant expiratory wheezes with good air movement.  HEART:  In regular rate and rhythm.  ABDOMEN:  Benign.  MUSCULOSKELETAL SURVEY AND SKIN REVIEW:  Unremarkable.    IMPRESSION:  History of seasonal allergies with lacrimalitis.  I am giving her Patanol ophthalmic solution.  I am also recommending holding any kind of eye makeup and gentle washing with baby shampoo.  She will continue her other medications, and she will follow up with Elon Jester C. Annabelle Harman, MD, as scheduled.     FOSRI:FOXS                       Cage Gupton H. Malen Gauze, NP  D:  08/23/2014 10:18:17            T:  08/23/2014 15:06:01            V/D:  [id number]               E:  ANDER/08/23/2014 18:23:48        ADDENDUM:  The Patanol drops were a copay of almost $250 ridiculously.  I have called in ketotifen (or Zyrtec drops) 0.025% in its place.     FOSRI:FOXS                       Leyland Kenna H. Malen Gauze, NP  D:  08/23/2014 10:28:45            T:  08/23/2014 15:13:13            V/D:   [id number]               E:  /                                    History of Present Illness  HPI          Review of Systems       Filed Vitals:    08/23/14 0808   BP: 142/84   Pulse: 66   Temp: 36.6 ?C (97.8 ?F)   Resp: 14   SpO2: 94%    Body mass index is 37.82 kg/(m^2).  Physical Exam      ASSESSMENT & PLAN:       ICD-9-CM ICD-10-CM    1. History of seasonal allergies V15.09 Z88.9

## 2014-08-23 NOTE — Progress Notes (Signed)
Cheryl Lowe is a 69 y.o. female who presents today with red, watering, burning eyes x 2 wks.    Pt states she has taken zyrtec and used eye drops, with no success.

## 2014-08-24 NOTE — Progress Notes (Signed)
FYI - OTC ketotifen (Zaditor is one brand) roughly $12.

## 2014-09-21 ENCOUNTER — Inpatient Hospital Stay: Admit: 2014-09-21 | Discharge: 2014-09-26 | Attending: MD

## 2014-09-21 ENCOUNTER — Other Ambulatory Visit: Admit: 2014-09-21

## 2014-09-21 ENCOUNTER — Inpatient Hospital Stay: Admit: 2014-09-21 | Attending: MD

## 2014-09-21 ENCOUNTER — Ambulatory Visit: Admit: 2014-09-21 | Discharge: 2014-09-21 | Attending: Family

## 2014-09-21 DIAGNOSIS — L659 Nonscarring hair loss, unspecified: Secondary | ICD-10-CM

## 2014-09-21 DIAGNOSIS — R072 Precordial pain: Secondary | ICD-10-CM

## 2014-09-21 DIAGNOSIS — M5440 Lumbago with sciatica, unspecified side: Secondary | ICD-10-CM

## 2014-09-21 LAB — TSH: TSH - Thyroid Stimulating Hormone: 1.58 u[IU]/mL (ref 0.34–5.60)

## 2014-09-21 MED ORDER — indomethacin (INDOCIN) 25 MG capsule
25 | ORAL_CAPSULE | Freq: Three times a day (TID) | ORAL | 0.00 refills | 30.00000 days | Status: DC
Start: 2014-09-21 — End: 2015-03-27

## 2014-09-21 MED ORDER — ranitidine (ZANTAC) 150 MG tablet
150 | ORAL_TABLET | Freq: Two times a day (BID) | ORAL | 1.00 refills | Status: AC
Start: 2014-09-21 — End: ?

## 2014-09-21 MED ORDER — fluticasone-salmeterol (ADVAIR) 500-50 mcg/dose DsDv
500-50 | Freq: Two times a day (BID) | RESPIRATORY_TRACT | Status: DC
Start: 2014-09-21 — End: 2014-12-25

## 2014-09-21 MED ORDER — albuterol (PROVENTIL HFA;VENTOLIN HFA) 90 mcg/actuation inhaler
90 | RESPIRATORY_TRACT | 2.00 refills | 17.00000 days | Status: DC
Start: 2014-09-21 — End: 2015-05-07

## 2014-09-21 NOTE — Telephone Encounter (Signed)
Spoke with Patient    Future Appointments  Date Time Provider Department Center   09/21/2014 3:10 PM [facility]   10/11/2014 10:20 AM Earline Mayotte, MD [facility]

## 2014-09-21 NOTE — Progress Notes (Signed)
Cheryl Lowe is a 69 y.o. female who reports burning in center of chest two days ago.   Reports pain is constant and phlegm in throat with out success to expel it. LD

## 2014-09-21 NOTE — Progress Notes (Signed)
Please see the patient instructions section of the chart.

## 2014-09-21 NOTE — Telephone Encounter (Signed)
Pt. Wants an appointment due to a problem in her lung that burns. Pt. Didn't care who she saw, but she did say she would prefer that she gets in with Dr. Annabelle Harman. Pt. Requesting a call back at [phone]    ECR

## 2014-09-21 NOTE — Telephone Encounter (Signed)
OK to schedule with Dr Annabelle Harman is she wants to wait, might be a couple of weeks. Can put on cancellation list too.  Or she can come see Raiford Noble if she wants in before then.

## 2014-09-22 NOTE — Progress Notes (Signed)
Cheryl Lowe is a 69 y.o. female, DOB 1945/12/24, who presents today for Chest Pain    Cheryl Lowe is here for complaints of substernal precordial chest pain.  She reports that she had this intermittently, a burning sensation, for 2+ weeks but in the last few days it has been more constant.  She states that it is unaffected by food, movement, unaccompanied by shortness of breath, although she feels more short of breath with her COPD.  She has had no palpitations, no nausea, vomiting or diarrhea.  She also reports that she has been losing a lot of hair recently.    PAST MEDICAL HISTORY:  Significant for COPD.  She is currently using only Spiriva once a day and rarely uses albuterol.  She is on no combined LABA and steroid inhalants.  She also has Zantac at home, that she takes only 1 tablet once in a great while.  She also reports a.m. hoarseness and a sore throat and rare occasional globus.    PHYSICAL EXAMINATION:  GENERAL:  She is alert and oriented.  HEENT:  Negative.  LUNGS:  Inspiratory and expiratory wheezes throughout with some scant crackling in the left mid chest region posteriorly but good air movement throughout.  HEART:  In regular rate and rhythm.  ABDOMEN:  Benign.  MUSCULOSKELETAL AND SKIN SURVEY:   Unremarkable for Cheryl Lowe.    EKG done today reveals completely normal sinus rhythm with normal morphology.    IMPRESSION:  1.  Precordial pain with questionable lung auscultations - my differential here is pleuritis versus gastroesophageal reflux disease or a combination thereof.  I am having her take the Zantac daily 150 mg b.i.d. and I am giving her a short course of indomethacin 50 mg t.i.d. with meals and I will refer her for a chest x-ray.  2.  Reports of alopecia - I will refer her for a TSH.  3.  Chronic obstructive pulmonary disease - she quit smoking 3 years ago and is congratulated on this once again after 45 year plus pack year history.  I am starting her on combined therapy and I have  given her written instructions on use of albuterol 2 puffs 15 minutes apart at least b.i.d., followed 30 minutes thereafter by Advair 1 puff b.i.d. with a repeat of this sequence and to take Spiriva midday.  I also reviewed proper inhalant technique and have instructed her on discontinuing the spacer once she gets herself trained in the proper technique.  4.  Complaints of low back pain - I am sending her for lumbar films to include flexion and extension and she will follow up after these are resulted.     FOSRI:BECKDA                     Osha Rane H. Malen Gauze, NP  D:  09/22/2014 11:30:09            T:  09/25/2014 16:24:17            V/D:  [id number]               E:  /                                      History of Present Illness  HPI          Review of Systems       Filed Vitals:  09/21/14 1507   BP: 136/74   Pulse: 72   Temp: 37.1 ?C (98.7 ?F)   Resp: 14   SpO2: 92%    Body mass index is 37.72 kg/(m^2).  Physical Exam      ASSESSMENT & PLAN:       ICD-9-CM ICD-10-CM    1. Precordial pain 786.51 R07.2 POC EKG      X-ray chest PA and lateral   2. 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 Mucopurulent chronic bronchitis 491.1 J41.1

## 2014-09-25 NOTE — Addendum Note (Signed)
Addended by: Willette Brace on: 09/25/2014 05:40 PM     Modules accepted: Orders

## 2014-09-25 NOTE — Telephone Encounter (Signed)
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  We can send to PT and/or offer SI joint injection in our office.

## 2014-09-25 NOTE — Telephone Encounter (Signed)
Pt advised, she doesn't want to do the injections until she's screaming with hurt, right now it's just annoying.    Physical Therapy would be best for her at this point.  Recovery Zone please.

## 2014-09-27 NOTE — Progress Notes (Signed)
Agree with assessment and plan of care

## 2014-09-28 ENCOUNTER — Encounter: Attending: Family

## 2014-10-11 ENCOUNTER — Ambulatory Visit: Admit: 2014-10-11 | Discharge: 2014-10-11 | Attending: MD

## 2014-10-11 DIAGNOSIS — J411 Mucopurulent chronic bronchitis: Secondary | ICD-10-CM

## 2014-10-11 MED ORDER — desonide (DESOWEN) 0.05 % cream
0.05 | Freq: Two times a day (BID) | TOPICAL | 0.00 refills | Status: AC
Start: 2014-10-11 — End: 2015-10-11

## 2014-10-11 MED ORDER — azithromycin (ZITHROMAX) 250 MG tablet
250 | ORAL_TABLET | Freq: Every day | ORAL | Status: AC
Start: 2014-10-11 — End: 2014-10-16

## 2014-10-11 MED ORDER — methylPREDNISolone (MEDROL DOSEPACK) 4 mg tablet
4 | ORAL_TABLET | ORAL | Status: AC
Start: 2014-10-11 — End: 2014-10-18

## 2014-10-11 NOTE — Progress Notes (Signed)
Please see the patient instructions section of the chart.

## 2014-10-11 NOTE — Progress Notes (Signed)
Cheryl Lowe is a 69 y.o. female is here for 6 month CPE and breast exam. Also to discuss test results. LD    RM 3

## 2014-10-11 NOTE — Progress Notes (Signed)
Cheryl Lowe is a 69 y.o. female, DOB Jun 01, 1945, who presents today for Follow-up and Breast Examination    Chief Complaint   Patient presents with   . Follow-up     Chronic conditions   . Breast Examination     Patient Active Problem List   Diagnosis SNOMED CT(R)   . Allergic rhinosinusitis ALLERGIC RHINITIS   . Candidiasis, cutaneous CANDIDIASIS OF SKIN   . Carpal tunnel syndrome, bilateral CARPAL TUNNEL SYNDROME   . Chronic low back pain CHRONIC LOW BACK PAIN   . Chronic neck pain CHRONIC NECK PAIN   . COPD (chronic obstructive pulmonary disease) CHRONIC OBSTRUCTIVE LUNG DISEASE   . DDD (degenerative disc disease) DEGENERATION OF INTERVERTEBRAL DISC   . Depression DEPRESSIVE DISORDER   . Family history of early CAD FAMILY HISTORY OF CORONARY ARTERIOSCLEROSIS   . HLD (hyperlipidemia) HYPERLIPIDEMIA   . Mixed incontinence MIXED URINARY INCONTINENCE   . 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 OA (osteoarthritis) OSTEOARTHRITIS   . Trochanteric bursitis of both hips TROCHANTERIC BURSITIS   . Bilateral low back pain with sciatica LUMBAGO WITH SCIATICA     Health Maintenance   Topic Date Due   . INFLUENZA VACCINE  09/28/2014   . MAMMOGRAM  10/13/2014   . DXA SCAN  10/20/2015   . COLONOSCOPY  02/21/2016   . PNEUMOCOCCAL POLYSACCHARIDE VACCINE AGE 54 AND OVER  Completed          History of Present Illness  HPI Comments: Cheryl Lowe comes in for follow up of her chronic medical issues and for a breast exam.  She has no specific concerns regarding her breast health but is due for a mammogram as well.  Reviewed and updated past medical history, social history, family history, review of systems and medications with changes as noted below.    She continues to lose hair.  We have checked her thyroid and ruled out anemia.  She has tried hair and nail supplements.  She does have and itchy rash on her scalp which she has not mentioned before.    She also would like me to check a lesion over right eyebrow.  She can peel  it off and it comes back.  There is never a sore or nonhealing wound.      She continues to have a lot of post nasal drainage with productive morning cough.  Using Flonase and Zyrtec already but not rinsing her nasal passages.  Will be traveling to [place], needs a  Z-pack and Medrol Dose Pack for the trip as she frequently has a COPD flare when she is traveling and with air travel.      Still having issues related to Urogyn surgery but has follow up with Dr. Delbert Harness.  She was told she may have some residual symptoms that cannot be resolved, especially as this was a redo surgery and she has mesh still in place.             Review of Systems   Constitutional: Positive for fatigue (improved with better management of depression). Negative for fever, chills, diaphoresis, activity change, appetite change and unexpected weight change.   HENT: Positive for congestion, postnasal drip and sneezing. Negative for dental problem, drooling, ear discharge, ear pain, facial swelling, hearing loss, mouth sores, nosebleeds, rhinorrhea, sinus pressure, sore throat, tinnitus, trouble swallowing and voice change.    Eyes: Negative for photophobia, pain, discharge, redness, itching and visual disturbance.   Respiratory: Positive for cough and  shortness of breath. Negative for choking, chest tightness, wheezing and stridor.         Chronic but currently worse symptoms.   Cardiovascular: Negative for chest pain, palpitations and leg swelling.   Gastrointestinal: Negative for nausea, vomiting, abdominal pain, diarrhea, constipation, blood in stool, abdominal distention, anal bleeding and rectal pain.   Endocrine: Negative for cold intolerance, heat intolerance, polydipsia, polyphagia and polyuria.   Genitourinary: Negative for dysuria and pelvic pain.        She continues to have some leakage of urine and discomfort but is much improved from prior to her most recent UroGyn surgery.   Musculoskeletal: Positive for myalgias, back pain,  arthralgias and neck pain. Negative for joint swelling and gait problem.   Skin: Negative for color change, pallor, rash and wound.        Itchy scalp.   Allergic/Immunologic: Positive for environmental allergies. Negative for food allergies and immunocompromised state.   Neurological: Negative for dizziness, tremors, seizures, syncope, facial asymmetry, speech difficulty, weakness, light-headedness, numbness and headaches.   Hematological: Negative for adenopathy. Does not bruise/bleed easily.   Psychiatric/Behavioral: Positive for dysphoric mood (improved). Negative for suicidal ideas, hallucinations, behavioral problems, confusion, sleep disturbance, self-injury, decreased concentration and agitation. The patient is nervous/anxious. The patient is not hyperactive.         Past Medical History   Diagnosis Date   . Trochanteric bursitis of both hips    . Allergic rhinosinusitis    . Depression    . HLD (hyperlipidemia)    . Tobacco abuse    . Obesity (BMI 30.0-34.9)    . Bilateral carpal tunnel syndrome    . Leaking of urine    . COPD (chronic obstructive pulmonary disease)    . Mixed incontinence    . Lumbago      2/2 DDD   . Chronic neck pain      2/2 DDD   . OA (osteoarthritis)    . History of acne    . SK (seborrheic keratosis)    . Multiple nevi    . Inflamed acrochordon    . AK (actinic keratosis)        Past Surgical History   Procedure Laterality Date   . Total abdominal hysterectomy  1974     For prolapse. Ovaries retained   . Repair rectocele  1998   . Cystocele repair  1998   . Shoulder surgery Right 1982     Removal of calcium deposits   . Cervical laminectomy  2005     [place], [place]   . Cervical fusion  2005     [place], [place]   . Sacropexy and urethral sling  08/05/2013     [place]   . Colporrhaphy  05/25/14     [place].  Posterior Colporrhaphy w/suburthetral sling.   . Cystourethroscopy  05/25/14     [place]       Social History     Social History   . Marital Status: Single     Spouse Name: N/A      . Number of Children: 2   . Years of Education: N/A     Occupational History   . Psychiatrist - Retired    . Formerly worked for [employer]      Social History Main Topics   . Smoking status: Former Smoker -- 1.00 packs/day for 45 years     Types: Cigarettes     Quit date: 01/20/2012   . Smokeless tobacco: Never  Used   . Alcohol Use: 2.4 - 4.8 oz/week     4-8 Glasses of wine per week      Comment: 1-2 glasses wine four nights a week   . Drug Use: No   . Sexual Activity: No     Other Topics Concern   . Not on file     Social History Narrative    Caffeine Use: 2 cups of coffee daily    History of: Measles, mumps    Previous Hearing Test: No    Allergy Testing: No    Allergy Shots: No    Pets: 1 dog and 1 cat       Family History   Problem Relation Age of Onset   . High Cholestrol Mother    . Hypertension Mother    . Hypothyroidism Mother    . Heart attack Father 15   . Bone cancer Maternal Uncle    . Cancer Maternal Aunt      Face and neck   . Stroke Father      Cerebral hemorrhage   . Alcohol abuse Father      /Drug abuse   . Diabetes Brother    . Hypertension Brother    . Hypertension Father    . High Cholestrol Father    . High Cholestrol Brother    . Leukemia Sister    . Diabetes Maternal Uncle    . Migraines Sister        Family Status   Relation Status Death Age   . Father Deceased 15     Cerebral hemorrhage   . Mother Alive    . Sister Deceased 53     Leukemia   . Brother Alive    . Sister Alive    . Son Alive    . Son Alive        Allergies   Allergen Reactions   . Keflex [Cephalexin] Anaphylaxis   . Penicillins Anaphylaxis and Swelling   . Ceftin [Cefuroxime Axetil] Swelling   . Wellbutrin [Bupropion Hcl] Other (See Comments)     Teeth clenching and tremors       Outpatient Prescriptions Marked as Taking for the 10/11/14 encounter (Office Visit) with Earline Mayotte, MD   Medication Sig Dispense Refill   . albuterol (ACCUNEB) 0.63 mg/3 mL nebulizer solution Take 3 mLs (0.63 mg total) by  nebulization every 4 (four) hours as needed for Wheezing. 90 vial 3   . albuterol (PROVENTIL HFA;VENTOLIN HFA) 90 mcg/actuation inhaler 2 puffs 15 min apart BID 1/2 hr before Advair, and qq 4 hrs prn wheezing 2 Inhaler 6   . cetirizine (ZYRTEC) 10 MG tablet Take 10 mg by mouth daily as needed.      . cholecalciferol, vitamin D3, (VITAMIN D3) 5,000 unit Tab Take by mouth daily.     . clotrimazole-betamethasone (LOTRISONE) lotion Apply  topically 2 (two) times daily. 30 mL 3   . fluticasone (FLONASE) 50 mcg/actuation nasal spray USE ONE SPRAY(S) IN EACH NOSTRIL TWICE DAILY 16 g 4   . fluticasone-salmeterol (ADVAIR) 500-50 mcg/dose DsDv Inhale 1 puff into the lungs every 12 (twelve) hours. 3 each 4   . indomethacin (INDOCIN) 25 MG capsule Take 2 capsules (50 mg total) by mouth 3 (three) times daily with meals. 180 capsule 1   . ketotifen (ZYRTEC ITCHY EYE DROPS, KETO,) 0.025 % (0.035 %) ophthalmic solution Place 1 drop into both eyes 3 (three) times daily. 10 mL 3   .  ranitidine (ZANTAC) 150 MG tablet Take 1 tablet (150 mg total) by mouth 2 (two) times daily. 180 tablet 3   . simvastatin (ZOCOR) 20 MG tablet TAKE ONE TABLET BY MOUTH ONCE DAILY 90 tablet 3   . SPIRIVA WITH HANDIHALER 18 mcg inhalation INHALE ONE CAPSULE (18 MCG TOTAL) INTO LUNGS ONCE DAILY 90 capsule 3   . VITAMIN B COMPLEX & VIT C NO.4 (SUPER B COMPLEX + C ORAL) Take by mouth daily.         Filed Vitals:    10/11/14 1038   BP: 136/80   Pulse: 68   Temp: 36.7 ?C (98.1 ?F)   Resp: 16   SpO2: 93%    Body mass index is 37.87 kg/(m^2).  Physical Exam   Constitutional: She is oriented to person, place, and time. She appears well-developed and well-nourished. No distress.   HENT:   Head: Normocephalic and atraumatic.   Mouth/Throat: Oropharynx is clear and moist. No oropharyngeal exudate (erythematous posterior oropharynx).   Eyes: Conjunctivae and EOM are normal. Pupils are equal, round, and reactive to light. Right eye exhibits no discharge. Left eye  exhibits no discharge. No scleral icterus.   Neck: Normal range of motion. Neck supple. No tracheal deviation present. No thyromegaly present.   Cardiovascular: Normal rate, regular rhythm, normal heart sounds and intact distal pulses.  Exam reveals no gallop and no friction rub.    No murmur heard.  Pulmonary/Chest: Effort normal and breath sounds normal. No stridor. No respiratory distress. She has no wheezes. She has no rales. Right breast exhibits no inverted nipple, no mass, no nipple discharge, no skin change and no tenderness. Left breast exhibits no inverted nipple, no mass, no nipple discharge, no skin change and no tenderness. Breasts are symmetrical.   Abdominal: Soft. Bowel sounds are normal. She exhibits no distension. There is no tenderness. There is no rebound and no guarding.   Musculoskeletal: Normal range of motion. She exhibits edema (trace). She exhibits no tenderness.   Lymphadenopathy:        Head (right side): No submental, no submandibular, no preauricular and no posterior auricular adenopathy present.        Head (left side): No submental, no submandibular, no preauricular and no posterior auricular adenopathy present.     She has no cervical adenopathy.     She has no axillary adenopathy.        Right axillary: No pectoral and no lateral adenopathy present.        Left axillary: No pectoral and no lateral adenopathy present.       Right: No supraclavicular adenopathy present.        Left: No supraclavicular adenopathy present.   Neurological: She is alert and oriented to person, place, and time. She has normal reflexes. No cranial nerve deficit. She exhibits normal muscle tone. Coordination normal.   Skin: Skin is warm and dry. No rash noted. She is not diaphoretic. No erythema. No pallor.   lesion over right eyebrow examined with dermatoscope, appears to be just a patch of flaky skin with a light pink base, no concerning features.    Psychiatric: She has a normal mood and affect. Her  behavior is normal. Judgment and thought content normal.   Nursing note and vitals reviewed.      Results for Cheryl, Lowe (MRN [id number]) as of 10/11/2014 10:50   Ref. Range 09/21/2014 16:51   TSH Latest Ref Range: 0.34-5.60 uIU/mL 1.58     ASSESSMENT &  PLAN:       ICD-9-CM ICD-10-CM    1. Mucopurulent chronic bronchitis 491.1 J41.1    2. Other allergic rhinitis 477.8 J30.89    3. Seborrheic dermatitis of scalp 690.18 L21.9    4. Chronic low back pain 724.2 M54.5     338.29 G89.29    5. Chronic neck pain 723.1 M54.2     338.29 G89.29    6. Primary osteoarthritis involving multiple joints 715.09 M15.0    7. Mixed incontinence 788.33 N39.46    8. Prolapse of vaginal walls 618.00 N81.10    9. Breast cancer screening V76.10 Z12.39 Mammography screening bilateral digital w cad     1. Mucopurulent chronic bronchitis with underlying 2. Other allergic rhinitis, poorly controlled  - She will continue her inhalers, Zyrtec and Flonase and we will have her add in some nasal rinses.  Discussed and demonstrated technique.    3. Seborrheic dermatitis of scalp, lesion over right eyebrow may be another patch of dermatitis or a seborrheic keratosis  - Will have her use shampoo with coal tar.  We did not discuss rotating shampoos because she has been using a dandruff shampoo for a long time.    4. Chronic low back pain and 5. Chronic neck pain with underlying 6. Primary osteoarthritis involving multiple joints  - Currently under reasonable control and her functional level is good    7. Mixed incontinence with history of 8. Prolapse of vaginal walls, continues to have bothersome symptoms after redo UroGyn surgery  - She will follow up with Dr. Delbert Harness as scheduled.    9. Breast cancer screening, normal exam today  - Mammography screening bilateral digital w cad; Future    See patient instructions.    Return in about 2 months (around 12/11/2014) for follow up rash on scalp and sinuses.

## 2014-10-11 NOTE — Patient Instructions (Addendum)
Use T-Gel shampoo (generic is fine, ingredient is coal tar).      Nasal rinses as we discussed.      Mammogram (Pending)  Office use only:  Date: 10/11/2014  Time: 11:49 AM  User Initials: JLG      Additional Info:   []  Please call to schedule an appointment.  []  We will call you with a scheduled appointment.   [x]  They will call you to schedule an appointment.    Procedure Instructions:  Allow 30 minutes.  No lotion, powder or talc, shower the morning of the test and wear a clean undergarment and nothing on skin.

## 2014-10-17 ENCOUNTER — Inpatient Hospital Stay: Admit: 2014-10-17 | Attending: MD

## 2014-10-17 DIAGNOSIS — Z1231 Encounter for screening mammogram for malignant neoplasm of breast: Secondary | ICD-10-CM

## 2014-10-18 NOTE — Telephone Encounter (Signed)
-----   Message from Earline Mayotte, MD sent at 10/17/2014  6:19 PM PDT -----  Advise patient of negative mammogram, record.

## 2014-10-18 NOTE — Telephone Encounter (Signed)
LMTRC. LD

## 2014-10-24 NOTE — Telephone Encounter (Signed)
I have attempted to call the patient 5 times with no voicemail or answer.  Please schedule patient once she calls [place].

## 2014-10-24 NOTE — Telephone Encounter (Signed)
Cheryl Lowe called and stated she will wait for the flu shot.  Patient is out of town and wont be back for [name]. JLG

## 2014-12-25 ENCOUNTER — Ambulatory Visit: Admit: 2014-12-25 | Discharge: 2014-12-25 | Attending: MD

## 2014-12-25 DIAGNOSIS — R21 Rash and other nonspecific skin eruption: Secondary | ICD-10-CM

## 2014-12-25 MED ORDER — flu vaccine (65 yr+) HIGH DOSE PF 2016-2017 (FLUZONE HIGH DOSE) syringe 0.5 mL
180 | Freq: Once | INTRAMUSCULAR | Status: AC
Start: 2014-12-25 — End: 2014-12-25
  Administered 2014-12-25: 19:00:00 180 via INTRAMUSCULAR

## 2014-12-25 MED ORDER — budesonide-formoterol (SYMBICORT) 160-4.5 mcg/actuation inhaler
160-4.5 | Freq: Two times a day (BID) | RESPIRATORY_TRACT | 12 refills | Status: AC
Start: 2014-12-25 — End: 2015-12-25

## 2014-12-25 MED ORDER — fluconazole (DIFLUCAN) 150 MG tablet
150 | ORAL_TABLET | Freq: Every day | ORAL | 0 refills | 3.00000 days | Status: DC
Start: 2014-12-25 — End: 2015-03-07

## 2014-12-25 NOTE — Progress Notes (Signed)
Please see the patient instructions section of the chart.

## 2014-12-25 NOTE — Patient Instructions (Addendum)
Start using Nizoral shampoo.  Leave on for ten minutes and then rinse.      CT Scan (Pending)  Office use only:  Date: 12/25/2014  Time: 11:08 AM  User Initials: BC     Additional Info: [phone]   []  Please call to schedule an appointment.  []  We will call you with a scheduled appointment.   [x]  They will call you to schedule an appointment.    Procedure Instructions:  Allow 30-45 minutes.      If oral contrast is needed you can mix the contrast with clear juice with no pulp and diabetics can use Crystal Light.

## 2014-12-25 NOTE — Progress Notes (Signed)
Cheryl Lowe is a 69 y.o. female prsents today for 2 month F/U on rash.    Flu shot received today.    Are you allergic to eggs?  no  Do you have a cold, fever, or acute illness?  no  Have you ever had Guilan-Barre syndrome?  no  Have you received a vaccine in the last 30 days?  no  Adverse reaction to this flu shot? no    VIS given.    Standing Paper Order on file as of (date)12/25/14, at (location) [facility]

## 2014-12-25 NOTE — Progress Notes (Signed)
Cheryl Lowe is a 69 y.o. female, DOB [birth year], who presents today for Follow-up (Rash on scalp, COPD) and Jaw Pain    Chief Complaint   Patient presents with   . Follow-up     Rash on scalp, COPD   . Jaw Pain     Patient Active Problem List   Diagnosis SNOMED CT(R)   . Allergic rhinosinusitis ALLERGIC RHINITIS   . Candidiasis, cutaneous CANDIDIASIS OF SKIN   . Carpal tunnel syndrome, bilateral CARPAL TUNNEL SYNDROME   . Chronic low back pain CHRONIC LOW BACK PAIN   . Chronic neck pain CHRONIC NECK PAIN   . COPD (chronic obstructive pulmonary disease) CHRONIC OBSTRUCTIVE LUNG DISEASE   . DDD (degenerative disc disease) DEGENERATION OF INTERVERTEBRAL DISC   . Depression DEPRESSIVE DISORDER   . Family history of early CAD FAMILY HISTORY OF CORONARY ARTERIOSCLEROSIS   . HLD (hyperlipidemia) HYPERLIPIDEMIA   . Mixed incontinence MIXED URINARY INCONTINENCE   . 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 OA (osteoarthritis) OSTEOARTHRITIS   . Trochanteric bursitis of both hips TROCHANTERIC BURSITIS   . Bilateral low back pain with sciatica LUMBAGO WITH SCIATICA   . Seborrheic dermatitis of scalp SEBORRHEIC DERMATITIS OF SCALP          History of Present Illness  HPI Comments: Rash on scalp is no better, very itchy, no changes with coal tar shampoo, feels that it has spread.  Desonide did not help.  Straight peroxide has helped.      Has had a few episodes of left submaxillary swelling while eating, balooned out and was very painful.  Took half to a day to resolve.  No fevers but did have strange tasting saliva.      Rescue inhaler not effective for shortness of breath.  Has a nebulizer but obviously can't use this when out and about or traveling, although this is helpful.  Not using the Advair regularly because she did not feel it was helping her.  We discussed changing over to Symbicort because it has some immediate effects compared to Advair.  Continues the Spiriva.         Review of Systems      Constitutional: Positive for fatigue (improved with better management of depression). Negative for activity change, appetite change, chills, diaphoresis, fever and unexpected weight change.   HENT: Positive for congestion, facial swelling (submandibular), postnasal drip and sneezing. Negative for dental problem, drooling, ear discharge, ear pain, hearing loss, mouth sores, nosebleeds, rhinorrhea, sinus pressure, sore throat, tinnitus, trouble swallowing and voice change.    Eyes: Negative for photophobia, pain, discharge, redness, itching and visual disturbance.   Respiratory: Positive for cough and shortness of breath. Negative for choking, chest tightness, wheezing and stridor.         Chronic but currently worse symptoms.   Cardiovascular: Negative for chest pain, palpitations and leg swelling.   Gastrointestinal: Negative for abdominal distention, abdominal pain, anal bleeding, blood in stool, constipation, diarrhea, nausea, rectal pain and vomiting.   Endocrine: Negative for cold intolerance, heat intolerance, polydipsia, polyphagia and polyuria.   Genitourinary: Negative for dysuria and pelvic pain.        She continues to have some leakage of urine and discomfort but is much improved from prior to her most recent UroGyn surgery.   Musculoskeletal: Positive for arthralgias, back pain, myalgias and neck pain. Negative for gait problem and joint swelling.   Skin: Negative for color change, pallor, rash and wound.  Itchy scalp.   Allergic/Immunologic: Positive for environmental allergies. Negative for food allergies and immunocompromised state.   Neurological: Negative for dizziness, tremors, seizures, syncope, facial asymmetry, speech difficulty, weakness, light-headedness, numbness and headaches.   Hematological: Positive for adenopathy. Does not bruise/bleed easily.   Psychiatric/Behavioral: Positive for dysphoric mood (improved). Negative for agitation, behavioral problems, confusion, decreased  concentration, hallucinations, self-injury, sleep disturbance and suicidal ideas. The patient is nervous/anxious. The patient is not hyperactive.         Allergies   Allergen Reactions   . Keflex [Cephalexin] Anaphylaxis   . Penicillins Anaphylaxis and Swelling   . Ceftin [Cefuroxime Axetil] Swelling   . Wellbutrin [Bupropion Hcl] Other (See Comments)     Teeth clenching and tremors       Outpatient Prescriptions Marked as Taking for the 12/25/14 encounter (Office Visit) with Earline Mayotte, MD   Medication Sig Dispense Refill   . albuterol (ACCUNEB) 0.63 mg/3 mL nebulizer solution Take 3 mLs (0.63 mg total) by nebulization every 4 (four) hours as needed for Wheezing. 90 vial 3   . albuterol (PROVENTIL HFA;VENTOLIN HFA) 90 mcg/actuation inhaler 2 puffs 15 min apart BID 1/2 hr before Advair, and qq 4 hrs prn wheezing 2 Inhaler 6   . cetirizine (ZYRTEC) 10 MG tablet Take 10 mg by mouth daily as needed.      . cholecalciferol, vitamin D3, (VITAMIN D3) 5,000 unit Tab Take by mouth daily.     Marland Kitchen desonide (DESOWEN) 0.05 % cream Apply topically 2 (two) times daily. 60 g 2   . fluticasone (FLONASE) 50 mcg/actuation nasal spray USE ONE SPRAY(S) IN EACH NOSTRIL TWICE DAILY 16 g 4   . indomethacin (INDOCIN) 25 MG capsule Take 2 capsules (50 mg total) by mouth 3 (three) times daily with meals. 180 capsule 1   . ketotifen (ZYRTEC ITCHY EYE DROPS, KETO,) 0.025 % (0.035 %) ophthalmic solution Place 1 drop into both eyes 3 (three) times daily. 10 mL 3   . ranitidine (ZANTAC) 150 MG tablet Take 1 tablet (150 mg total) by mouth 2 (two) times daily. 180 tablet 3   . simvastatin (ZOCOR) 20 MG tablet TAKE ONE TABLET BY MOUTH ONCE DAILY 90 tablet 3   . SPIRIVA WITH HANDIHALER 18 mcg inhalation INHALE ONE CAPSULE (18 MCG TOTAL) INTO LUNGS ONCE DAILY 90 capsule 3   . VITAMIN B COMPLEX & VIT C NO.4 (SUPER B COMPLEX + C ORAL) Take by mouth daily.     . [DISCONTINUED] clotrimazole-betamethasone (LOTRISONE) lotion Apply  topically 2 (two)  times daily. 30 mL 3   . [DISCONTINUED] fluticasone-salmeterol (ADVAIR) 500-50 mcg/dose DsDv Inhale 1 puff into the lungs every 12 (twelve) hours. 3 each 4       Vitals:    12/25/14 1002   Pulse: 72   Resp: 20   Temp: 36.3 ?C (97.4 ?F)   SpO2: 91%    There is no height or weight on file to calculate BMI.  Physical Exam   Constitutional: She is oriented to person, place, and time. She appears well-developed and well-nourished. No distress.   HENT:   Head: Normocephalic and atraumatic.       Mouth/Throat: Oropharynx is clear and moist. No oropharyngeal exudate (no visible sialaliths or inflammed ducts).   Neurological: She is alert and oriented to person, place, and time. No cranial nerve deficit.   Skin: Rash (pink papular rash on entire scalp with excoriations) noted. She is not diaphoretic.   Psychiatric: She has a  normal mood and affect. Her behavior is normal. Judgment and thought content normal.   Nursing note and vitals reviewed.        ASSESSMENT & PLAN:       ICD-9-CM ICD-10-CM    1. Parotiditis 527.2 K11.20 CT facial bones without contrast   2. Rash 782.1 R21    3. Candidiasis, cutaneous 112.3 B37.2    4. Mucopurulent chronic bronchitis 491.1 J41.1    5. Needs flu shot V04.81 Z23 flu vaccine (65 yr+) HIGH DOSE PF 2016-2017 (FLUZONE HIGH DOSE) syringe 0.5 mL     1. Parotiditis, appears to be resolving so we will not prescribe antibiotics at this point  - Discussed use of fresh lemon juice to promote salivation and clear out her salivary glands, recurrent swelling would indicate presence of obstructive stone  - CT facial bones without contrast; Future (without contrast to look for salivary stones)    2. Rash on scalp, history 3. Candidiasis, cutaneous in body folds, failed treatment for seborrheic dermatitis  - Will treat with Nizoral shampoo and give her a few days of Diflucan to see if this is effective.    4. Mucopurulent chronic bronchitis, flares not well treated with albuterol and still quite  symptomatic on Advair and Spiriva  - Change the Advair to Symbicort which seems to have more immediate symptom relief, although it is not a rescue inhaler  - Nebulizer when possible    5. Needs flu shot  - flu vaccine (65 yr+) HIGH DOSE PF 2016-2017 (FLUZONE HIGH DOSE) syringe 0.5 mL; Inject 0.5 mLs into the muscle once.    See patient instructions.    Return in about 3 months (around 03/27/2015) for follow up.

## 2015-01-17 ENCOUNTER — Inpatient Hospital Stay: Admit: 2015-01-17 | Attending: MD

## 2015-01-17 DIAGNOSIS — R933 Abnormal findings on diagnostic imaging of other parts of digestive tract: Secondary | ICD-10-CM

## 2015-01-18 NOTE — Addendum Note (Signed)
Addended by: Willette Brace on: 01/18/2015 08:04 PM     Modules accepted: Orders

## 2015-01-18 NOTE — Telephone Encounter (Signed)
Done

## 2015-01-18 NOTE — Telephone Encounter (Signed)
-----   Message from Earline Mayotte, MD sent at 01/17/2015  8:46 PM PST -----  Advise patient the salivary glands all look normal and there are no stones.  That doesn't mean she did pass a stone through there.  The tonsil on the left is enlarged which may be related to whatever process was going on.  Does she still have tenderness on that side?  If so, we could try a round of antibiotics and see if this all resolves.

## 2015-01-18 NOTE — Telephone Encounter (Signed)
Advised patient of all information and she verbalized understanding. There is a little tenderness and her chest is a little tight. She would like to go ahead and try the abx.

## 2015-01-19 MED ORDER — clindamycin (CLEOCIN) 300 MG capsule
300 | ORAL_CAPSULE | Freq: Three times a day (TID) | ORAL | 0 refills | 5.50000 days | Status: AC
Start: 2015-01-19 — End: 2015-01-28

## 2015-02-19 NOTE — Telephone Encounter (Signed)
I attempted to contact patient, but went to VM after one ring. Caryn MA asked that this be a 40 minute same day when she calls back. BC

## 2015-02-19 NOTE — Telephone Encounter (Signed)
Next Appointment: Future Appointments  Future Appointments  Date Time Provider Department Center   03/27/2015 11:20 AM Earline Mayotte, MD [facility]        Symptoms & Location: Whole right side of face down to neck is swollen, also pain under arm, and headache.    Onset: unchanged    Duration: For about 3 days    Patient Request: Pt. Requested an appointment with anyone        Pt. Said if she can't get seen today she is going to go to a walk in

## 2015-02-19 NOTE — Telephone Encounter (Signed)
Cheryl Lowe called and I gave her the times that were available today with the NP.  Patient is  An hour out of town for travel and  4 pm appointment was to late.  Cheryl Lowe was very appreciative of this offer however due to weather did not want to wait that late in the afternoon.  Patient has choose to go to the walk in clinic ( [facility])     JLG

## 2015-02-19 NOTE — Telephone Encounter (Signed)
OK to see NP today? JPY

## 2015-02-19 NOTE — Telephone Encounter (Signed)
Yes, get her in with NP.

## 2015-02-21 NOTE — Telephone Encounter (Signed)
Cheryl Lowe went to [facility] 02/19/15 and they gave predisone  60 mg pills  7 days  3 tables in the morning.   2 times a day Sudogest 60 mg  For 10 days   Side effects she is experiencing are Bouncing off the walls, hardly able to sleep,  and her face is beat red, shaking like a leaf these symptoms started yesterday afternoon after second Sudogest.   Zekiah is requesting a callback and she has stopped taking these medications due to the effects she is having.     Please advise     [phone] is the patient phone    JLG

## 2015-02-21 NOTE — Telephone Encounter (Signed)
Gave patient instructions and she expressed understanding. JPY

## 2015-02-21 NOTE — Telephone Encounter (Signed)
Please advise. JPY

## 2015-02-21 NOTE — Telephone Encounter (Signed)
It is probably from the high dose Prednisone.  Have her cut down to 40mg  for five days.

## 2015-03-05 NOTE — Telephone Encounter (Signed)
Pt called and stated that the medicated shampoo that was prescribed to her is not working. Pt stated that she has treated herself three times now and the rash on her scalp is unchanged and would like to have a referral to dermatology.     Pt also stated that there is a bump on her head that disappears every time she washes her hair and comes back bigger a few days after, this has been going on for a month. Pt would like [place] to know that her sister did have cancer on her head and it is similar to her situation. Pt would like to know what to do about the bump.    Please Advise.    TBB

## 2015-03-05 NOTE — Telephone Encounter (Signed)
Referral to Derm placed.  Show the lump to Derm, not sure what she could do different in the meantime.

## 2015-03-05 NOTE — Addendum Note (Signed)
Addended by: Willette Brace on: 03/05/2015 06:24 PM     Modules accepted: Orders

## 2015-03-06 NOTE — Telephone Encounter (Signed)
LMOM informing pt referral was placed.     mkb

## 2015-03-07 ENCOUNTER — Ambulatory Visit: Admit: 2015-03-07 | Discharge: 2015-03-07

## 2015-03-07 DIAGNOSIS — B078 Other viral warts: Secondary | ICD-10-CM

## 2015-03-07 MED ORDER — betamethasone valerate (VALISONE) 0.1 % lotion
0.1 | Freq: Two times a day (BID) | TOPICAL | 3 refills | Status: DC
Start: 2015-03-07 — End: 2015-05-07

## 2015-03-07 MED ORDER — clindamycin (CLEOCIN T) 1 % lotion
1 | Freq: Two times a day (BID) | TOPICAL | 3 refills | 5.50000 days | Status: DC
Start: 2015-03-07 — End: 2015-05-07

## 2015-03-07 NOTE — Assessment & Plan Note (Signed)
Relevant Hx:  Course:  Daily Update:  Today's Plan:one lesion in the right frontal scalp treated with cryotherapy for 20 se X2

## 2015-03-07 NOTE — Assessment & Plan Note (Signed)
Relevant Hx:  Course:  Daily Update:  Today's Plan: Benign lesions. No additional therapy required

## 2015-03-07 NOTE — Progress Notes (Signed)
Cheryl Lowe is a 70 y.o. female here for a fbse. No personal history of skin cancer. AP

## 2015-03-07 NOTE — Assessment & Plan Note (Signed)
Relevant Hx:  Course:  Daily Update:  Today's Plan:Benign looking melanocytic nevi: monitor for any sign of malignancy

## 2015-03-07 NOTE — Progress Notes (Signed)
[facility] Patient Progress Note     Patient Name: Cheryl Lowe  Date of Birth: 1945/09/11   Referring Provider: Earline Mayotte, Md  [address]  [address]  [place], [place] [address]     Date: 03/07/2015     Chief Complaint:   Chief Complaint   Patient presents with   . multiple scattered skin lesions   . chronic, itchy scalp          History of Present Illness   HPI  Pt has fair skin and a h/o prolonged sun exposure without adequate photoprotection. She does not wear sunscreen.  Ms.Goodreau is being referred for fbse. No personal history of skin cancer. She has a lesion in the right front scalp that has been present for several months. It is tender but does has not bleed. No medications have been used to treat it. Her posterior scalp has been itchy for several months. Her PCP prescribed an anti-fungal medication and now she is using T-sal shampoo that does not seem to be helping. Ms. Jester also reports a rough spot in the left eyebrow, present for months w/o being tender nor bleeding.   No other new/changing moles noticed/reported   No other skin lesions that are tender or bleeding  Social History   Substance Use Topics   . Smoking status: Former Smoker     Packs/day: 1.00     Years: 45.00     Types: Cigarettes     Quit date: 01/20/2012   . Smokeless tobacco: Never Used   . Alcohol use 2.4 - 4.8 oz/week     4 - 8 Glasses of wine per week      Comment: 1-2 glasses wine four nights a week                Review of Systems   Review of Systems   Constitutional: Positive for fatigue. Negative for activity change, appetite change, chills, diaphoresis, fever and unexpected weight change.   HENT: Positive for facial swelling and hearing loss. Negative for congestion, dental problem, mouth sores, nosebleeds, sore throat and trouble swallowing.    Eyes: Positive for discharge, redness, itching and visual disturbance. Negative for photophobia and pain.   Respiratory: Positive for wheezing. Negative for chest tightness  and shortness of breath.    Cardiovascular: Negative for chest pain and leg swelling.   Gastrointestinal: Negative for abdominal pain, anal bleeding, blood in stool, diarrhea, nausea, rectal pain and vomiting.   Endocrine: Negative for cold intolerance and heat intolerance.   Genitourinary: Negative for difficulty urinating, dysuria, frequency, genital sores, hematuria, pelvic pain, urgency, vaginal bleeding and vaginal pain.   Musculoskeletal: Positive for arthralgias, back pain and neck stiffness. Negative for gait problem, joint swelling, myalgias and neck pain.   Skin: Negative for color change, rash and wound.   Allergic/Immunologic: Positive for environmental allergies. Negative for food allergies and immunocompromised state.   Neurological: Negative for dizziness, seizures, facial asymmetry, speech difficulty, weakness, numbness and headaches.   Hematological: Positive for adenopathy. Does not bruise/bleed easily.   Psychiatric/Behavioral: Negative for confusion and dysphoric mood.               Physical Exam     Visit Vitals   . BP (!) 132/92 (BP Location: Right arm, Patient Position: Sitting)   . Pulse 73   . Temp 36.7 ?C (98.1 ?F) (Temporal)   . Resp 16   . Ht 5' 2 (1.575 m)  Comment: pt  reported   . Wt 200 lb (90.7 kg)  Comment: pt reported   . SpO2 94%  Comment: room air   . BMI 36.58 kg/m2     DERMATOLOGY PHYSICAL EXAM    Normal Findings: general appearance, alert/oriented x3, eye-lids/conj, neck/thyroid, lips/teeth/gums, oral mucosa/tongue, lymphatics, neck, chest/breast/shoulder/axl, abdomen, back, butt, groin, right arm, left arm, right leg, left leg and digits      Abnormal Findings: scalp/hair and head/face        Fitzpatrick skin type ll  Well developed, well nourished 70 y.o. female. Normocephalic, atraumatic. Affect normal. Extraocular muscles are intact . Conjunctivae and sclerae are normal .Dentition is normal. Buccal mucosa, tongue, pharynx, gingivae are normal. Neck is supple. No  thyromegaly present. Breathing is unlabored. Gait normal. No LE's clubbing, cyanosis, edema.  No cervical, occipital, submental, submandibular, lymphadenopathy.    TBSE: There is no evidence of malignant neoplasms of the skin, inflammatory lesions or conditions, other infectious lesions, degenerative lesions, pigmented/melanocytic lesions, self-procured excoriations/abrasions in the areas examined  (scalp, face, conjunctivae/sclerae, mouth, neck, ears, chest, back, abdomen, buttocks, upper and lower extremities).                      Assessment & Plan       ICD-9-CM ICD-10-CM    1. Other viral warts 078.19 B07.8    2. Actinic keratosis 702.0 L57.0    3. Folliculitis 704.8 L73.9    4. Seborrheic keratoses 702.19 L82.1    5. Multiple benign melanocytic nevi 216.9 D22.9        Verrucae vulgaris  Relevant Hx:  Course:  Daily Update:  Today's Plan:one lesion in the right frontal scalp treated with cryotherapy for 20 se X2    Actinic keratosis  Relevant Hx:  Course:  Daily Update:  Today's Plan:one lesion in the left eyebrow treated with cryoablation for 15 sec    Folliculitis  Relevant Hx:  Course:  Daily Update:  Today's Plan:apply clindamycin and betaVal lotion bid to the posterior scalp    Seborrheic keratoses  Relevant Hx:  Course:  Daily Update:  Today's Plan:Benign lesions. No additional therapy required      Multiple benign melanocytic nevi  Relevant Hx:  Course:  Daily Update:  Today's Plan:Benign looking melanocytic nevi: monitor for any sign of malignancy        Non-Hospital Problem List as of 03/07/2015        Non-Hospital    Allergic rhinosinusitis    Candidiasis, cutaneous    Carpal tunnel syndrome, bilateral    Chronic low back pain    Chronic neck pain    COPD (chronic obstructive pulmonary disease) (CMS/HCC)    DDD (degenerative disc disease)    Depression    Family history of early CAD    HLD (hyperlipidemia)    Mixed incontinence    Prolapse of vaginal walls    OA (osteoarthritis)    Trochanteric bursitis  of both hips    Bilateral low back pain with sciatica    Seborrheic dermatitis of scalp    Lump of scalp    Verrucae vulgaris    Actinic keratosis    Folliculitis    Seborrheic keratoses    Multiple benign melanocytic nevi                    Patient Education: Diagnosis/Treatment plans  Sunscreen/sun protection  Melanoma ABCD's  Self skin examination    Return in about 1 year (around 03/06/2016).

## 2015-03-07 NOTE — Assessment & Plan Note (Signed)
Relevant Hx:  Course:  Daily Update:  Today's Plan:apply clindamycin and betaVal lotion bid to the posterior scalp

## 2015-03-07 NOTE — Assessment & Plan Note (Signed)
Relevant Hx:  Course:  Daily Update:  Today's Plan:one lesion in the left eyebrow treated with cryoablation for 15 sec

## 2015-03-27 ENCOUNTER — Ambulatory Visit: Admit: 2015-03-27 | Discharge: 2015-03-27 | Attending: MD

## 2015-03-27 DIAGNOSIS — E559 Vitamin D deficiency, unspecified: Secondary | ICD-10-CM

## 2015-03-27 MED ORDER — ketotifen (ZADITOR) 0.025 % (0.035 %) ophthalmic solution
0.025 | Freq: Three times a day (TID) | OPHTHALMIC | 3 refills | Status: AC
Start: 2015-03-27 — End: ?

## 2015-03-27 NOTE — Progress Notes (Signed)
Cheryl Lowe is a 70 y.o. female who presents today for a 57mo f/u.    RM 3

## 2015-03-27 NOTE — Progress Notes (Signed)
Please see the patient instructions section of the chart.

## 2015-03-27 NOTE — Patient Instructions (Addendum)
Check your blood pressure at home a few times and let us know where you running.    Use eye drops with ketotifen (Zatidor is one brand).      Fasting labs anytime and we will call with results.    Lab Orders  Office use only:  Date: 03/27/2015  Time: 12:30 PM  User Initials: ECR     Additional Info:   Patient Information:  [x]  Fasting []  Non-fasting lab orders.  When?: Soon within 2 weeks    (Fast for 10-12 hours, may have water and usually medications only)      As a reminder our office requires you to arrive at the time of or before your scheduled appointment, failure to due so may result in a reschedule of your appointment.     REMINDER: PLEASE BRING THIS LIST OF MEDICATIONS WITH YOU TO YOUR NEXT APPOINTMENT.   Please review the medications on this sheet with the medications you have at home. If you find a discrepancy or there are changes between now and your next appointment please make note on the after visit summary.

## 2015-03-27 NOTE — Progress Notes (Signed)
03/27/15 1212   Does the patient have the following Problems:    Diabetes?  0   COPD? 2   CHF? 0   Any other chronic diseases?  0   Chronic Pain? 0   Dementia? 0   Mental Health Diagnosis? 2   Substance Abuse? 0   Medication   Is the patient taking more than 4 medications?  1   Patients BMI   Is Patient's BMI over 40?  0   Utilization   High Utilization?  0   Patient's Social Status   Homeless? 0   Foster Care? 0   OTHER   Risk Score TOTAL 5   Risk Status Score MEDIUM RISK

## 2015-03-27 NOTE — Progress Notes (Signed)
Cheryl Lowe is a 70 y.o. female, DOB 1945/04/30, who presents today for Follow-up (eye burning, lack of energy)    Patient Active Problem List   Diagnosis SNOMED CT(R)   . Allergic rhinosinusitis ALLERGIC RHINITIS   . Candidiasis, cutaneous CANDIDIASIS OF SKIN   . Carpal tunnel syndrome, bilateral CARPAL TUNNEL SYNDROME   . Chronic low back pain CHRONIC LOW BACK PAIN   . Chronic neck pain CHRONIC NECK PAIN   . COPD (chronic obstructive pulmonary disease) (CMS/HCC) CHRONIC OBSTRUCTIVE LUNG DISEASE   . DDD (degenerative disc disease) DEGENERATION OF INTERVERTEBRAL DISC   . Depression DEPRESSIVE DISORDER   . Family history of early CAD FAMILY HISTORY OF CORONARY ARTERIOSCLEROSIS   . HLD (hyperlipidemia) HYPERLIPIDEMIA   . Mixed incontinence MIXED URINARY INCONTINENCE   . 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 OA (osteoarthritis) OSTEOARTHRITIS   . Trochanteric bursitis of both hips TROCHANTERIC BURSITIS   . Bilateral low back pain with sciatica LUMBAGO WITH SCIATICA   . Seborrheic dermatitis of scalp SEBORRHEIC DERMATITIS OF SCALP   . Lump of scalp MASS OF HEAD   . Verrucae vulgaris VERRUCA VULGARIS   . Actinic keratosis ACTINIC KERATOSIS   . Folliculitis FOLLICULITIS   . Seborrheic keratoses SEBORRHEIC KERATOSIS   . Multiple benign melanocytic nevi MULTIPLE BENIGN MELANOCYTIC NEVI         History of Present Illness  HPI Comments: Breathing has been ok but she feels like she is getting a cold.      Concerned about blood pressure in the office, hasn't been checking at home.    Eyes are still bothering her, itching, burning, film on them all day, a lot of tearing.     Vitamins not helping with energy level (vitamin D and B complex).            Review of Systems   Constitutional: Positive for fatigue (improved with better management of depression). Negative for activity change, appetite change, chills, diaphoresis, fever and unexpected weight change.   HENT: Positive for congestion, postnasal drip and  sneezing. Negative for dental problem, drooling, ear discharge, ear pain, facial swelling, hearing loss, mouth sores, nosebleeds, rhinorrhea, sinus pressure, sore throat, tinnitus, trouble swallowing and voice change.    Eyes: Positive for pain and redness. Negative for photophobia, discharge, itching and visual disturbance.   Respiratory: Positive for cough and shortness of breath. Negative for choking, chest tightness, wheezing and stridor.         Chronic but currently worse symptoms.   Cardiovascular: Negative for chest pain, palpitations and leg swelling.   Endocrine: Negative for polyuria.   Genitourinary: Negative for dysuria.        She continues to have some leakage of urine and discomfort but is much improved from prior to her most recent UroGyn surgery.   Musculoskeletal: Positive for arthralgias, back pain, myalgias and neck pain. Negative for gait problem and joint swelling.   Skin:        Itchy scalp.   Allergic/Immunologic: Positive for environmental allergies. Negative for food allergies and immunocompromised state.   Neurological: Negative for dizziness, tremors, seizures, syncope, facial asymmetry, speech difficulty, weakness, light-headedness, numbness and headaches.   Psychiatric/Behavioral: Positive for dysphoric mood (improved). Negative for agitation, behavioral problems, confusion, decreased concentration, hallucinations, self-injury, sleep disturbance and suicidal ideas. The patient is nervous/anxious. The patient is not hyperactive.         Allergies   Allergen Reactions   . Keflex [Cephalexin] Anaphylaxis   .  Penicillins Anaphylaxis and Swelling   . Ceftin [Cefuroxime Axetil] Swelling   . Wellbutrin [Bupropion Hcl] Other (See Comments)     Teeth clenching and tremors       Outpatient Prescriptions Marked as Taking for the 03/27/15 encounter (Office Visit) with Earline Mayotte, MD   Medication Sig Dispense Refill   . albuterol (ACCUNEB) 0.63 mg/3 mL nebulizer solution Take 3 mLs (0.63 mg  total) by nebulization every 4 (four) hours as needed for Wheezing. 90 vial 3   . albuterol (PROVENTIL HFA;VENTOLIN HFA) 90 mcg/actuation inhaler 2 puffs 15 min apart BID 1/2 hr before Advair, and qq 4 hrs prn wheezing 2 Inhaler 6   . betamethasone valerate (VALISONE) 0.1 % lotion Apply topically 2 (two) times daily. (Patient taking differently: Apply topically as needed (rash. mkb). ) 60 mL 3   . budesonide-formoterol (SYMBICORT) 160-4.5 mcg/actuation inhaler Inhale 2 puffs into the lungs 2 (two) times daily. 1 Inhaler 12   . cetirizine (ZYRTEC) 10 MG tablet Take 10 mg by mouth daily as needed.      . cholecalciferol, vitamin D3, (VITAMIN D3) 5,000 unit Tab Take by mouth daily.     . clindamycin (CLEOCIN T) 1 % lotion Apply topically 2 (two) times daily. 60 mL 3   . desonide (DESOWEN) 0.05 % cream Apply topically 2 (two) times daily. 60 g 2   . fluticasone (FLONASE) 50 mcg/actuation nasal spray USE ONE SPRAY(S) IN EACH NOSTRIL TWICE DAILY 16 g 4   . ketotifen (ZADITOR) 0.025 % (0.035 %) ophthalmic solution Place 1 drop into both eyes 3 (three) times daily. 10 mL 3   . ranitidine (ZANTAC) 150 MG tablet Take 1 tablet (150 mg total) by mouth 2 (two) times daily. 180 tablet 3   . simvastatin (ZOCOR) 20 MG tablet TAKE ONE TABLET BY MOUTH ONCE DAILY 90 tablet 3   . SPIRIVA WITH HANDIHALER 18 mcg inhalation INHALE ONE CAPSULE (18 MCG TOTAL) INTO LUNGS ONCE DAILY 90 capsule 3   . VITAMIN B COMPLEX & VIT C NO.4 (SUPER B COMPLEX + C ORAL) Take by mouth daily.         Vitals:    03/27/15 1133   BP: (!) 140/92   Pulse: 70   Resp: 12   Temp: 36.5 ?C (97.7 ?F)   SpO2: 93%    There is no height or weight on file to calculate BMI.  Physical Exam   Constitutional: She is oriented to person, place, and time. She appears well-developed and well-nourished. No distress.   Eyes: Pupils are equal, round, and reactive to light. Right eye exhibits no discharge. Left eye exhibits no discharge. No scleral icterus.   Neurological: She is  alert and oriented to person, place, and time. No cranial nerve deficit.   Skin: She is not diaphoretic.   Psychiatric: She has a normal mood and affect. Her behavior is normal. Judgment and thought content normal.   Nursing note and vitals reviewed.        ASSESSMENT & PLAN:       ICD-9-CM ICD-10-CM    1. Hypovitaminosis D 268.9 E55.9 25-Hydroxyvitamin D2 And D3, Serum -Routine   2. Chronic fatigue 780.79 R53.82 Comprehensive Metabolic Panel -Routine      CBC with Auto Differential -Routine   3. Other hyperlipidemia 272.4 E78.4 Lipid Panel [facility]) -Routine      Comprehensive Metabolic Panel -Routine   4. Eye irritation 379.99 H57.8    5. Elevated blood pressure 401.9 I10  1. Hypovitaminosis D  - 25-Hydroxyvitamin D2 And D3, Serum -Routine; Future    2. Chronic fatigue  - Comprehensive Metabolic Panel -Routine; Future  - CBC with Auto Differential -Routine; Future    3. Other hyperlipidemia  - Lipid Panel [facility]) -Routine; Future  - Comprehensive Metabolic Panel -Routine; Future    4. Eye irritation  - Will have her go back to ketotifen eye drops and she will let us know if this is not effective.  Next step would be steroid eye drops    5. Elevated blood pressure  - Asked her to check her pressures at home and let us know what those numbers are reading    See patient instructions.    Return in about 1 month (around 04/24/2015) for follow up eyes, lungs, energy.    20 minutes of face to face time was spent with the patient, greater than half of this time was spent counseling the patient regarding management of eye symptoms, blood pressure.

## 2015-04-05 NOTE — Telephone Encounter (Signed)
Cheryl Lowe calls in asking how to go about getting new hosing and liquid holder for her nebulizer? She ask that you please call her back with this information.

## 2015-04-05 NOTE — Telephone Encounter (Signed)
I called Cheryl Lowe. She stated that she got her original Nebulizer from [facility]. I advised her that we can send over a request to see if they will supply that to her.   I called [employer]. They just need order form stating Nebulizer Tubing, Demographics, and Chart notes.   I have filled out the [employer] form, and got all the information ready. I just need Dr. Janne Napoleon signature.     I have advised Cheryl Lowe that we will get is fax over to [facility] as soon as we can. She stated that is fine. She has been sick for a while and it is starting to move into her chest and she has a green sputum coming up. She wanted to know if she could get an antibiotic? DLB

## 2015-04-06 MED ORDER — azithromycin (ZITHROMAX) 250 MG tablet
250 | ORAL_TABLET | Freq: Every day | ORAL | 0 refills | Status: AC
Start: 2015-04-06 — End: 2015-04-11

## 2015-04-06 NOTE — Telephone Encounter (Signed)
Left message to return call. DLB

## 2015-04-06 NOTE — Telephone Encounter (Signed)
Advise patient I will send in a Z-Pack.

## 2015-05-01 ENCOUNTER — Encounter: Attending: MD

## 2015-05-04 ENCOUNTER — Other Ambulatory Visit: Admit: 2015-05-04

## 2015-05-04 DIAGNOSIS — E784 Other hyperlipidemia: Secondary | ICD-10-CM

## 2015-05-04 LAB — LIPID PANEL
Chol/HDL Ratio: 3 (ref 0.0–5.0)
Cholesterol, HDL: 63 mg/dL (ref >=60)
Cholesterol: 187 mg/dL (ref <=200)
LDL Calculated: 112 mg/dL (ref 0–129)
Triglyceride: 62 mg/dL (ref <=150.0)
VLDL Cholesterol Calculation: 12.4 mg/dL (ref 7.0–32.0)

## 2015-05-04 LAB — CBC WITH AUTO DIFFERENTIAL
Basophils %: 1 % (ref 0–2)
Basophils, Absolute: 0 10*3/??L (ref 0.0–0.1)
Eosinophils %: 3 % (ref 0–5)
Eosinophils, Absolute: 0.2 10*3/??L (ref 0.0–0.4)
HCT: 42.9 % (ref 35.0–48.0)
Hemoglobin: 14.2 g/dL (ref 11.7–16.5)
Lymphocytes %: 29 % (ref 20–40)
Lymphocytes, Absolute: 1.6 10*3/??L (ref 1.0–4.0)
MCH: 32.3 pg (ref 28.3–33.3)
MCHC: 33 g/dL (ref 32.5–36.0)
MCV: 97.7 fL (ref 81.0–100.0)
MPV: 8.5 fL (ref 6.9–10.0)
Monocytes %: 8 % (ref 2–12)
Monocytes, Absolute: 0.4 10*3/??L (ref 0.2–1.0)
Neutrophils %: 59 % (ref 50–74)
Neutrophils, Absolute: 3.3 10*3/??L (ref 2.0–7.4)
Platelet Count: 213 10*3/??L (ref 150–405)
RBC: 4.39 10*6/??L (ref 3.80–5.60)
RDW: 13.2 % (ref 11.7–16.1)
WBC: 5.5 10*3/??L (ref 4.5–11.0)

## 2015-05-04 LAB — COMPREHENSIVE METABOLIC PANEL
ALT - Alanine Aminotransferase: 29 U/L (ref 5–33)
AST - Aspartate Aminotransferase: 26 U/L (ref 5–32)
Albumin/Globulin Ratio: 1.3 (ref 1.2–2.2)
Albumin: 4.1 g/dL (ref 3.5–5.0)
Alkaline Phosphatase: 67 U/L (ref 35–105)
Anion Gap: 18 mmol/L (ref 12.0–20.0)
BUN / Creatinine Ratio: 21.5 — ABNORMAL HIGH (ref 12.0–20.0)
BUN: 14 mg/dL (ref 8–20)
Bilirubin Total: 0.6 mg/dL (ref 0.10–1.70)
CO2 - Carbon Dioxide: 23 mmol/L (ref 22–32)
Calcium: 8.8 mg/dL (ref 8.6–10.6)
Chloride: 105.6 mmol/L (ref 101.0–111.0)
Creatinine: 0.65 mg/dL (ref 0.60–1.30)
Glomerular Filtration Rate Estimate: >60 mL/min/{1.73_m2} (ref >=60.0)
Glucose: 109 mg/dL — ABNORMAL HIGH (ref 74–106)
Osmolality Calculation: 284 mOsm/kg (ref 275.0–300.0)
Potassium: 4.45 mmol/L (ref 3.50–5.10)
Protein Total: 7.3 g/dL (ref 6.1–7.9)
Sodium: 142 mmol/L (ref 135–145)

## 2015-05-04 LAB — 25-HYDROXYVITAMIN D, LC/MS/MS (MAYO)
25-Hydroxy D Total: 47 ng/mL
25-Hydroxy D2: <4 ng/mL
25-Hydroxy D3: 47 ng/mL

## 2015-05-04 NOTE — Telephone Encounter (Signed)
I called patient and switched appointments and have her scheduled with Anne ANP. [facility]    Future Appointments  Date Time Provider Department Center   05/07/2015 10:00 AM Gifford Shave, NP [facility]

## 2015-05-04 NOTE — Telephone Encounter (Signed)
Dr Annabelle Harman ok'd the appointment swap

## 2015-05-04 NOTE — Telephone Encounter (Signed)
Patient came in asking if her friend could come in her place for her appointment next Monday and she is ok seeing a NP for her follow up. Please advise if this is ok. I have sent a note for his chart as well. [facility]    Future Appointments  Date Time Provider Department Center   05/07/2015 10:40 AM Earline Mayotte, MD [facility]

## 2015-05-07 ENCOUNTER — Ambulatory Visit: Admit: 2015-05-07 | Discharge: 2015-05-07 | Attending: Adult Health

## 2015-05-07 ENCOUNTER — Encounter: Attending: MD

## 2015-05-07 DIAGNOSIS — E784 Other hyperlipidemia: Secondary | ICD-10-CM

## 2015-05-07 MED ORDER — fluticasone (FLONASE) 50 mcg/actuation nasal spray
50 | NASAL | 4 refills | 75.00000 days | Status: AC
Start: 2015-05-07 — End: ?

## 2015-05-07 MED ORDER — albuterol (PROVENTIL HFA;VENTOLIN HFA;PROAIR HFA) 90 mcg/actuation inhaler
90 | RESPIRATORY_TRACT | 6 refills | Status: AC
Start: 2015-05-07 — End: ?

## 2015-05-07 MED ORDER — simvastatin (ZOCOR) 20 MG tablet
20 | ORAL_TABLET | ORAL | 3 refills | Status: AC
Start: 2015-05-07 — End: ?

## 2015-05-07 MED ORDER — tiotropium (SPIRIVA WITH HANDIHALER) 18 mcg inhalation
18 | ORAL_CAPSULE | RESPIRATORY_TRACT | 3 refills | 30.00000 days | Status: AC
Start: 2015-05-07 — End: ?

## 2015-05-07 MED ORDER — betamethasone valerate (VALISONE) 0.1 % lotion
0.1 | Freq: Two times a day (BID) | TOPICAL | 3 refills | Status: AC
Start: 2015-05-07 — End: 2016-05-06

## 2015-05-07 MED ORDER — albuterol (ACCUNEB) 0.63 mg/3 mL nebulizer solution
0.63 | RESPIRATORY_TRACT | 3 refills | Status: DC | PRN
Start: 2015-05-07 — End: 2015-08-20

## 2015-05-07 NOTE — Telephone Encounter (Signed)
Patient was given scripts today. BC

## 2015-05-07 NOTE — Telephone Encounter (Signed)
Ok to give hard copies? Pt missed her last appt.

## 2015-05-07 NOTE — Telephone Encounter (Signed)
Incoming fax from [place]  regarding Transmission Verification Report:    Our records indicate that the prescribing practitioner prescribed home medical equipment...      Form to MA to Process    TBB

## 2015-05-07 NOTE — Progress Notes (Signed)
Chief Complaint   Patient presents with   . Follow-up     HPI  Keierra Nudo is a 70 y.o. female w h/o copd, low vitamin d , HLD, seasonal allergies who presents for f/u elevated bp and lab review   Was advised to check bps at home and return with log. bp at last visit was 140/92.  Vitamin d was drawn 05/04/15  and is still pending   Cbc on 05/04/15 was normal, cmp showed slightly elevatred cr, glu 109. Lipids at goal on statin.   On spiriva and albuterol for copd , just got over a cold , states she is breathing well now   Had been c.o itchy eyes, advised restart zatidor which she states worked really well.     Review of Systems   Respiratory: Negative for shortness of breath.    Cardiovascular: Negative for chest pain.        Patient Active Problem List   Diagnosis SNOMED CT(R)   . Allergic rhinosinusitis ALLERGIC RHINITIS   . Candidiasis, cutaneous CANDIDIASIS OF SKIN   . Carpal tunnel syndrome, bilateral CARPAL TUNNEL SYNDROME   . Chronic low back pain CHRONIC LOW BACK PAIN   . Chronic neck pain CHRONIC NECK PAIN   . COPD (chronic obstructive pulmonary disease) (CMS/HCC) CHRONIC OBSTRUCTIVE LUNG DISEASE   . DDD (degenerative disc disease) DEGENERATION OF INTERVERTEBRAL DISC   . Depression DEPRESSIVE DISORDER   . Family history of early CAD FAMILY HISTORY OF CORONARY ARTERIOSCLEROSIS   . HLD (hyperlipidemia) HYPERLIPIDEMIA   . Mixed incontinence MIXED URINARY INCONTINENCE   . 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 OA (osteoarthritis) OSTEOARTHRITIS   . Trochanteric bursitis of both hips TROCHANTERIC BURSITIS   . Bilateral low back pain with sciatica LUMBAGO WITH SCIATICA   . Seborrheic dermatitis of scalp SEBORRHEIC DERMATITIS OF SCALP   . Lump of scalp MASS OF HEAD   . Verrucae vulgaris VERRUCA VULGARIS   . Actinic keratosis ACTINIC KERATOSIS   . Folliculitis FOLLICULITIS   . Seborrheic keratoses SEBORRHEIC KERATOSIS   . Multiple benign melanocytic nevi MULTIPLE BENIGN MELANOCYTIC NEVI       Current  Outpatient Prescriptions   Medication   . albuterol (ACCUNEB) 0.63 mg/3 mL nebulizer solution   . albuterol (PROVENTIL HFA;VENTOLIN HFA) 90 mcg/actuation inhaler   . betamethasone valerate (VALISONE) 0.1 % lotion   . budesonide-formoterol (SYMBICORT) 160-4.5 mcg/actuation inhaler   . cetirizine (ZYRTEC) 10 MG tablet   . cholecalciferol, vitamin D3, (VITAMIN D3) 5,000 unit Tab   . desonide (DESOWEN) 0.05 % cream   . fluticasone (FLONASE) 50 mcg/actuation nasal spray   . ketotifen (ZADITOR) 0.025 % (0.035 %) ophthalmic solution   . ranitidine (ZANTAC) 150 MG tablet   . simvastatin (ZOCOR) 20 MG tablet   . SPIRIVA WITH HANDIHALER 18 mcg inhalation   . VITAMIN B COMPLEX & VIT C NO.4 (SUPER B COMPLEX + C ORAL)     No current facility-administered medications for this visit.        Past Medical History:   Diagnosis Date   . AK (actinic keratosis)    . Allergic rhinosinusitis    . Bilateral carpal tunnel syndrome    . Chronic neck pain     2/2 DDD   . COPD (chronic obstructive pulmonary disease) (CMS/HCC)    . Depression    . History of acne    . HLD (hyperlipidemia)    . Inflamed acrochordon    . Leaking  of urine    . Lumbago     2/2 DDD   . Mixed incontinence    . Multiple nevi    . OA (osteoarthritis)    . Obesity (BMI 30.0-34.9)    . SK (seborrheic keratosis)    . Tobacco abuse    . Trochanteric bursitis of both hips        Allergies   Allergen Reactions   . Keflex [Cephalexin] Anaphylaxis   . Penicillins Anaphylaxis and Swelling   . Ceftin [Cefuroxime Axetil] Swelling   . Wellbutrin [Bupropion Hcl] Other (See Comments)     Teeth clenching and tremors       Social History   Substance Use Topics   . Smoking status: Former Smoker     Packs/day: 1.00     Years: 45.00     Types: Cigarettes     Quit date: 01/20/2012   . Smokeless tobacco: Never Used   . Alcohol use 2.4 - 4.8 oz/week     4 - 8 Glasses of wine per week      Comment: 1-2 glasses wine four nights a week       Past Surgical History:   Procedure Laterality Date      . CERVICAL FUSION  [address] [place]   . CERVICAL LAMINECTOMY  [address], [place]   . COLPORRHAPHY  05/25/14    [place].  Posterior Colporrhaphy w/suburthetral sling.   . CYSTOCELE REPAIR  1998   . CYSTOURETHROSCOPY  05/25/14    [place]   . REPAIR RECTOCELE  1998   . SACROPEXY AND URETHRAL SLING  08/05/2013    [place]   . SHOULDER SURGERY Right 1982    Removal of calcium deposits   . TOTAL ABDOMINAL HYSTERECTOMY  1974    For prolapse. Ovaries retained       Family History   Problem Relation Age of Onset   . High Cholestrol Mother    . Hypertension Mother    . Hypothyroidism Mother    . Heart attack Father 1   . Bone cancer Maternal Uncle    . Cancer Maternal Aunt      Face and neck   . Stroke Father      Cerebral hemorrhage   . Alcohol abuse Father      /Drug abuse   . Diabetes Brother    . Hypertension Brother    . Hypertension Father    . High Cholestrol Father    . High Cholestrol Brother    . Leukemia Sister    . Diabetes Maternal Uncle    . Migraines Sister          Vitals:    05/07/15 0958   BP: 126/70   Pulse: 76   Resp: 18   SpO2: 92%      Body mass index is 38.53 kg/(m^2).      Physical Exam   Constitutional: She is oriented to person, place, and time. No distress.   Cardiovascular: Normal rate, regular rhythm and normal heart sounds.    No murmur heard.  Pulmonary/Chest: Effort normal. No respiratory distress. She has wheezes (mild in rul ). She has no rales.   Neurological: She is alert and oriented to person, place, and time.   Skin: Skin is warm and dry. She is not diaphoretic.   Psychiatric: Memory, affect and judgment normal.         Assessment   Malayna Noori is a  70 y.o. female who presents for f/u of elevated bp and lab review   Pt is moving to [place] in may to live there full time.         SNOMED CT(R)    1. Other hyperlipidemia  HYPERLIPIDEMIA Lipids wnl   Continue low animal fat diet , remain active to promote hld  continue simvastatin 20 mg qd   2. Hypovitaminosis D  VITAMIN  D DEFICIENCY Will await vit d level. Will call with results    3. Other emphysema (CMS/HCC)  PULMONARY EMPHYSEMA Doing well now  Continue spiriva, symbicort   ventolin prn    4. Eye irritation  DISORDER OF EYE zatidor working well    5. Elevated BP without diagnosis of hypertension  ELEVATED BLOOD-PRESSURE READING WITHOUT DIAGNOSIS OF HYPERTENSION bp wnl today.  Salt avoidance    6. Elevated fbs  Mildly elevated, although discussed lowering high carb food intake, achievement of optimum body weight to avoid continued elevations

## 2015-05-07 NOTE — Telephone Encounter (Signed)
Patient came in asking for a hard copy of the following medications to take with her to [place] to cover her until she finds a new provider.     Albuterol nebulizer solution  Albuterol inhaler  Betamethasone valerate  Budesonide-formoterol  Fluticasone  Simvastatin  Spiriva with handihaler    BC

## 2015-05-07 NOTE — Telephone Encounter (Signed)
Per Anne's AVS note:    Needs appt with dr Tonette Lederer in early may   To get scripts before she moves

## 2015-05-07 NOTE — Telephone Encounter (Signed)
Gave pt hard copies

## 2015-05-07 NOTE — Progress Notes (Signed)
Cheryl Lowe is a 70 y.o. female here for 1 month follow up on eyes, lungs, energy. DLB

## 2015-05-08 NOTE — Telephone Encounter (Signed)
Spoke with [name] . Advised note below. Patient verbalized understanding.

## 2015-05-08 NOTE — Telephone Encounter (Signed)
-----   Message from Earline Mayotte, MD sent at 05/08/2015  8:57 AM PDT -----  Advise patient that all her labs look great.

## 2015-05-08 NOTE — Telephone Encounter (Signed)
Faxed back.

## 2015-08-20 MED ORDER — albuterol (ACCUNEB) 0.63 mg/3 mL nebulizer solution
0.63 | RESPIRATORY_TRACT | 3 refills | Status: AC | PRN
Start: 2015-08-20 — End: ?

## 2015-08-20 NOTE — Telephone Encounter (Signed)
Fax from [facility] requesting a new rx for the Albuterol nebulizer sol with a diagnosis code so that they can bill the patients Medicare part B

## 2015-08-20 NOTE — Telephone Encounter (Signed)
New rx sent with requested info.

## 2016-02-27 IMAGING — MG MAMMOGRAPHY SCREENING BILATERAL 3D TOMOSYNTHESIS WITH CAD
12 series · 12 of 28 positions shown · non-contrast
Comparison: None.
BREAST DENSITY: (Level A) The breasts are almost entirely fatty.

******** ADDENDUM #1 ********/n
Addendum:
We have received comparison mammograms from 10/17/2014 through 10/09/2011.
There
has been no significant change between old and new mammograms.
REPORT ********
MAMMOGRAPHY SCREENING BILATERAL 3D TOMOSYNTHESIS WITH CAD, 02/27/2016 [DATE]:
CLINICAL INDICATION: Screening.
TECHNIQUE: Digital bilateral mammograms and 3-D Tomosynthesis were obtained.
These were interpreted both primarily and with the aid of computer-aided
detection system.

[L MLO synth-2D]
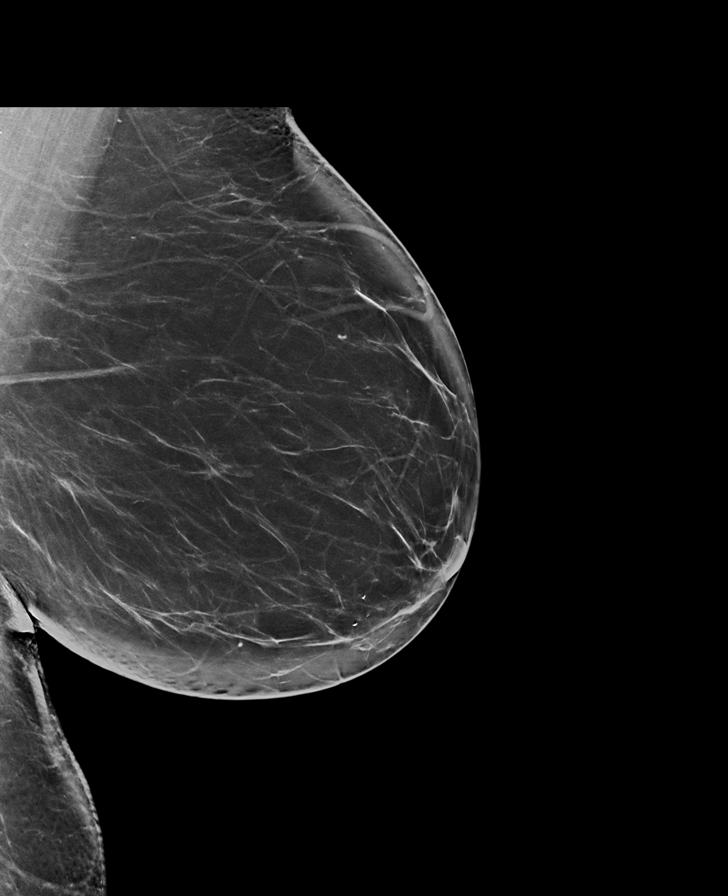

[R CC synth-2D]
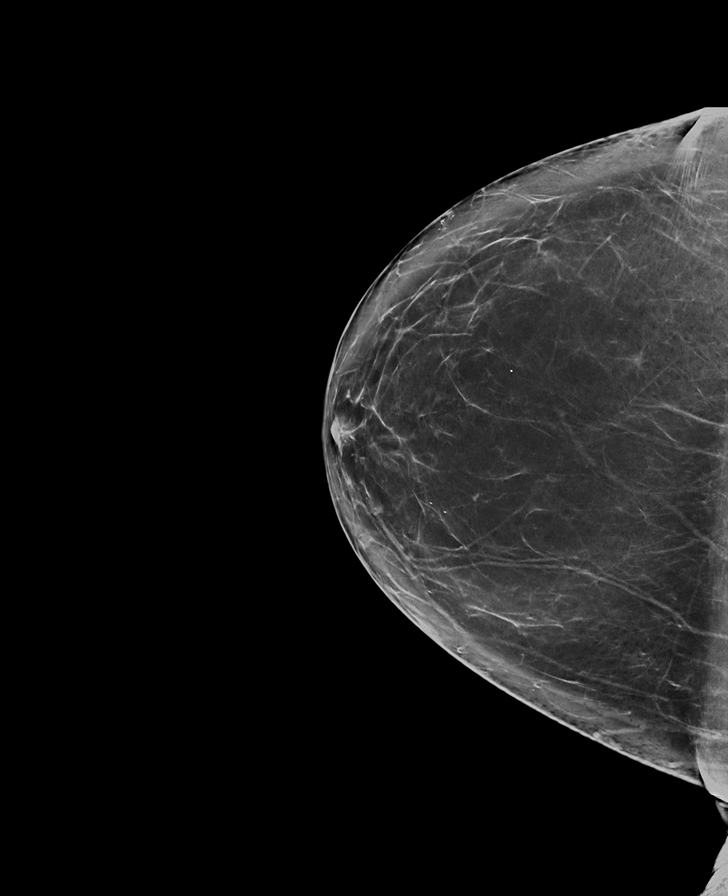

[R MLO synth-2D]
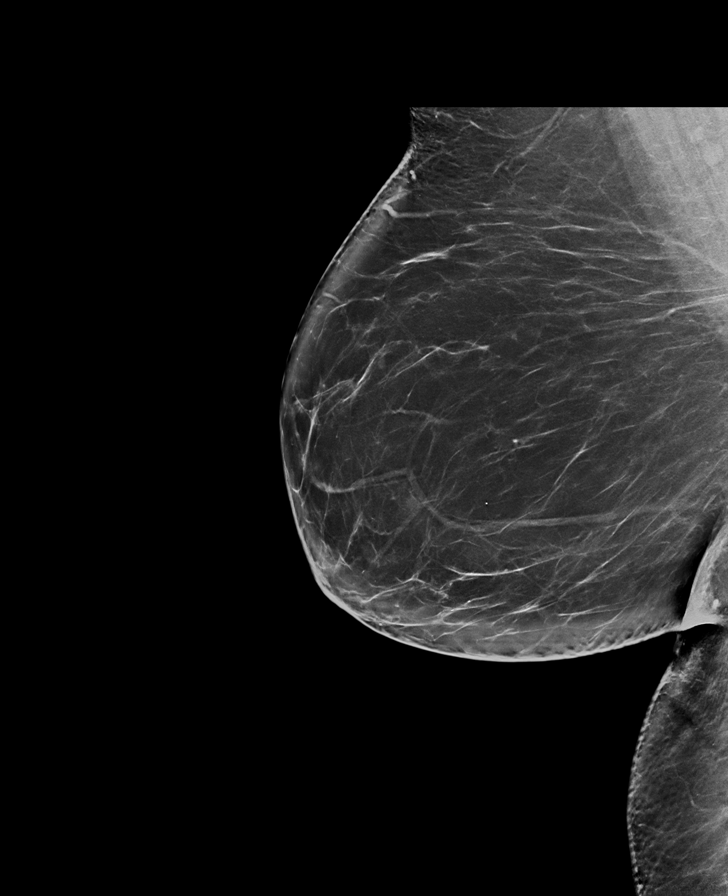

[L CC synth-2D]
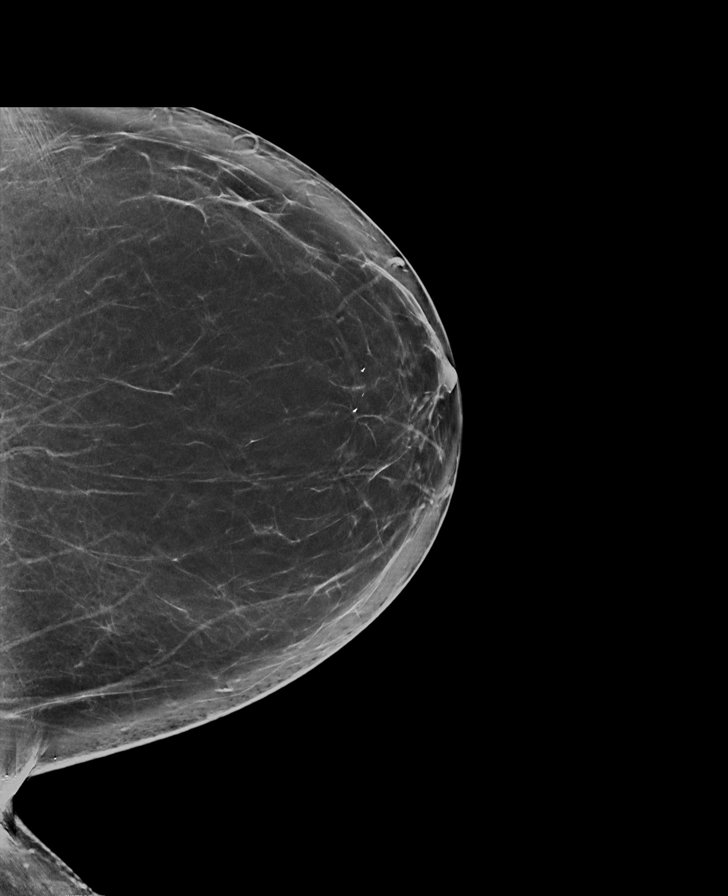

[L CC tomo (1 of 2)]
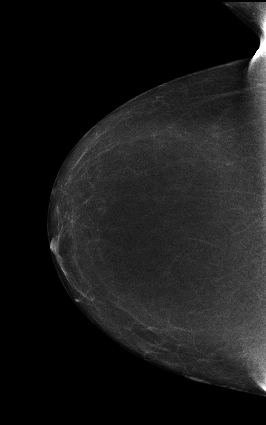

[R CC tomo (1 of 2)]
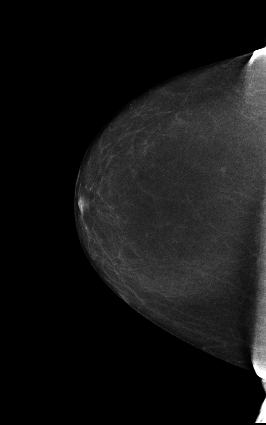

[R MLO tomo (1 of 2)]
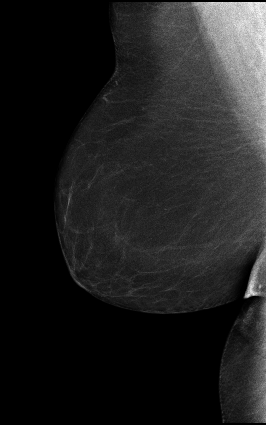

[L MLO tomo (1 of 2)]
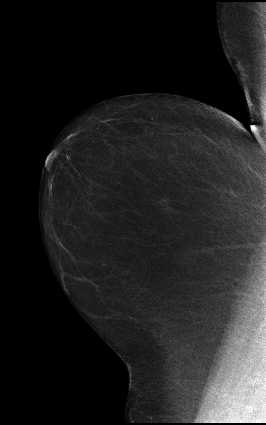

[R CC tomo (2 of 2) · tomo slice 49/96.0]
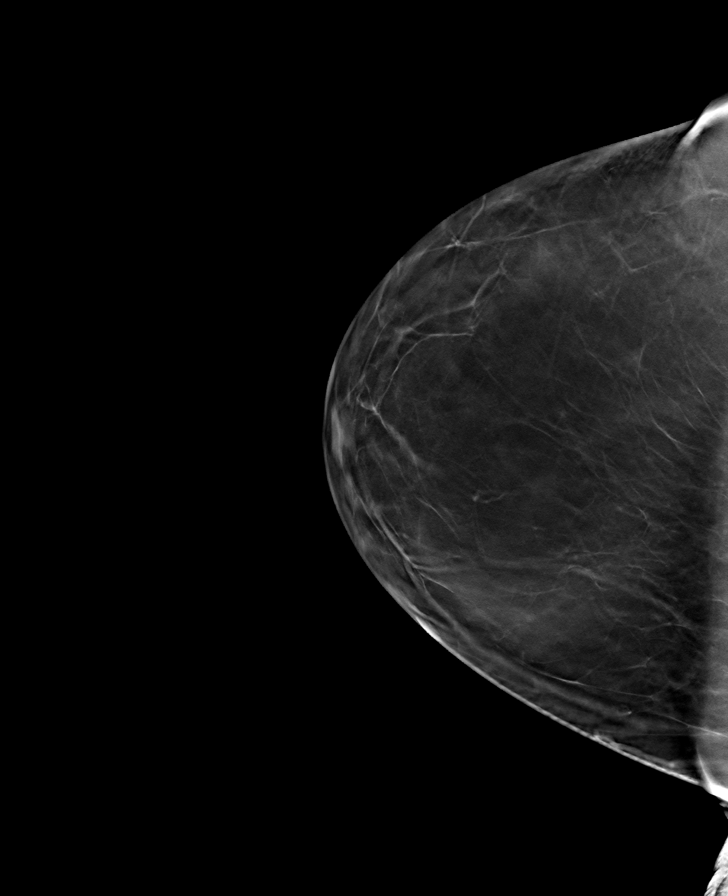

[L CC tomo (2 of 2) · tomo slice 49/98.0]
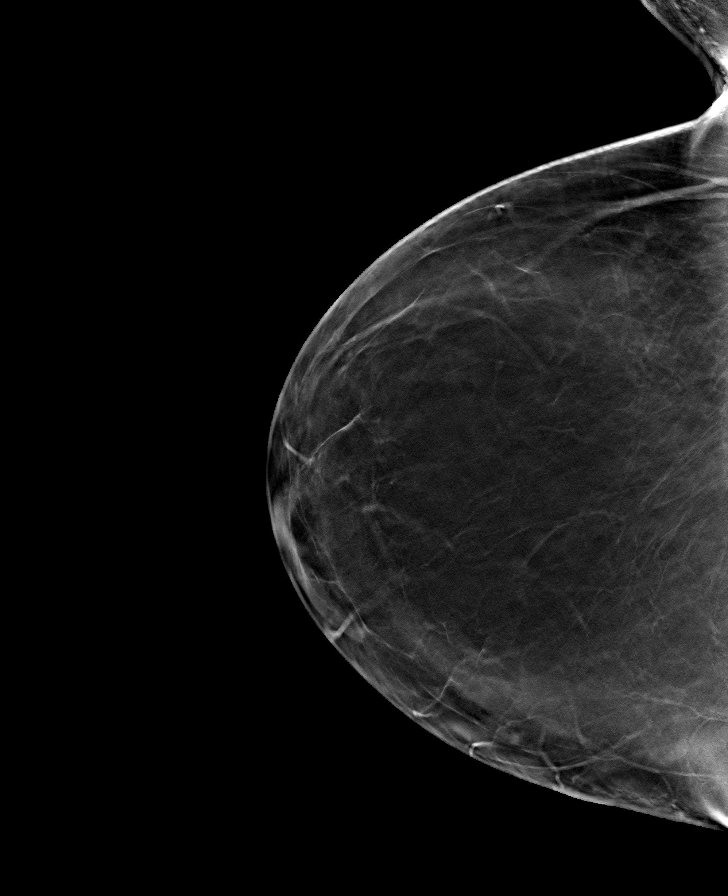

[R MLO tomo (2 of 2) · tomo slice 53/104.0]
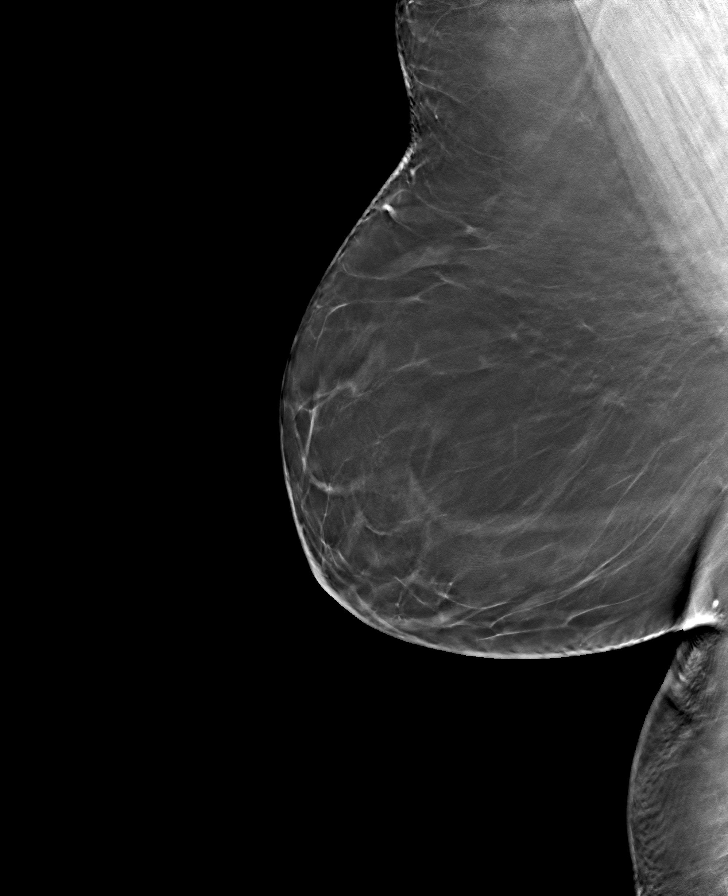

[L MLO tomo (2 of 2) · tomo slice 53/105.0]
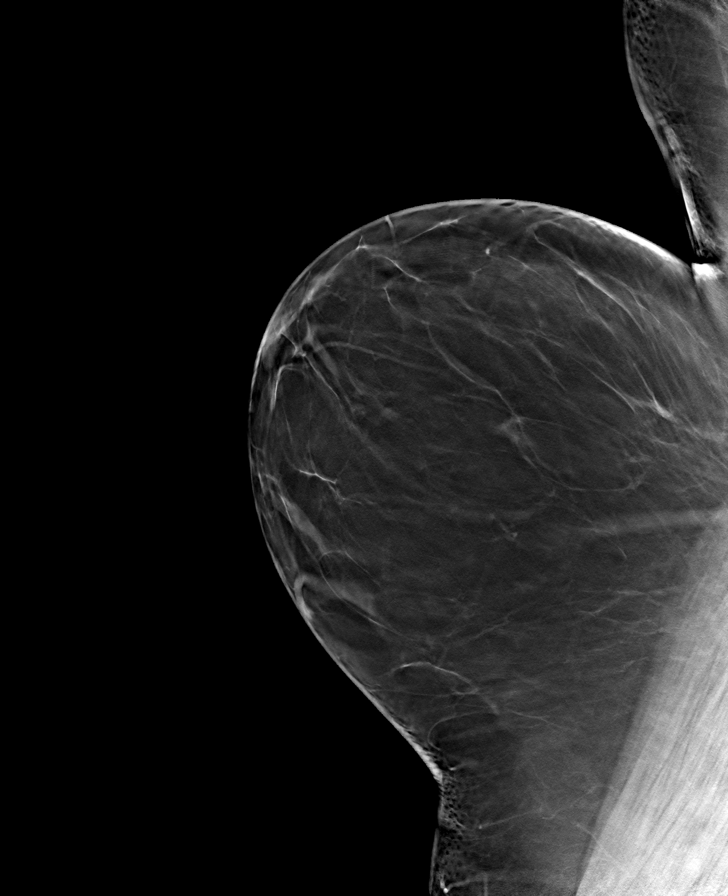

[12 of 28 positions shown; findings below may reference images not displayed]

FINDINGS: There are no mammographically suspicious findings.
IMPRESSION: ( BI-RADS 2) Benign findings. Routine mammographic follow-up is recommended.

## 2018-01-07 IMAGING — DX SKELETAL SURVEY
1 series · 8 of 10 positions shown · non-contrast
Comparison: None

SKELETAL SURVEY, 01/07/2018 [DATE]: 
CLINICAL INDICATION: Abnormal serum propane. Looking for osteolytic bone lesion.

[Series 1: PA · U · 0.12mm/px · 8 of 18 slices shown]
[im 1/18]
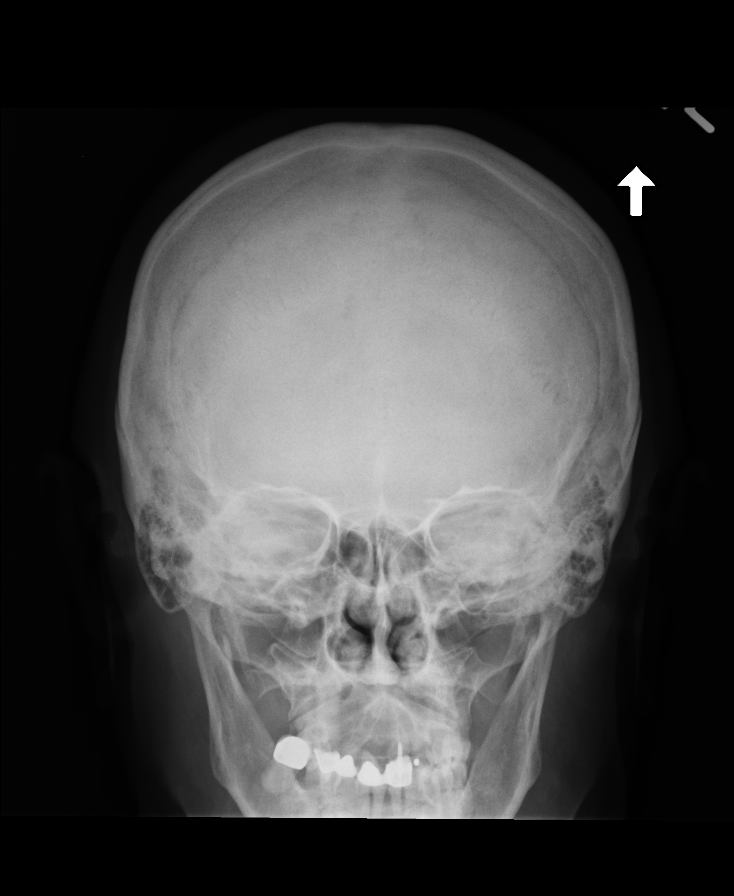
[im 2/18]
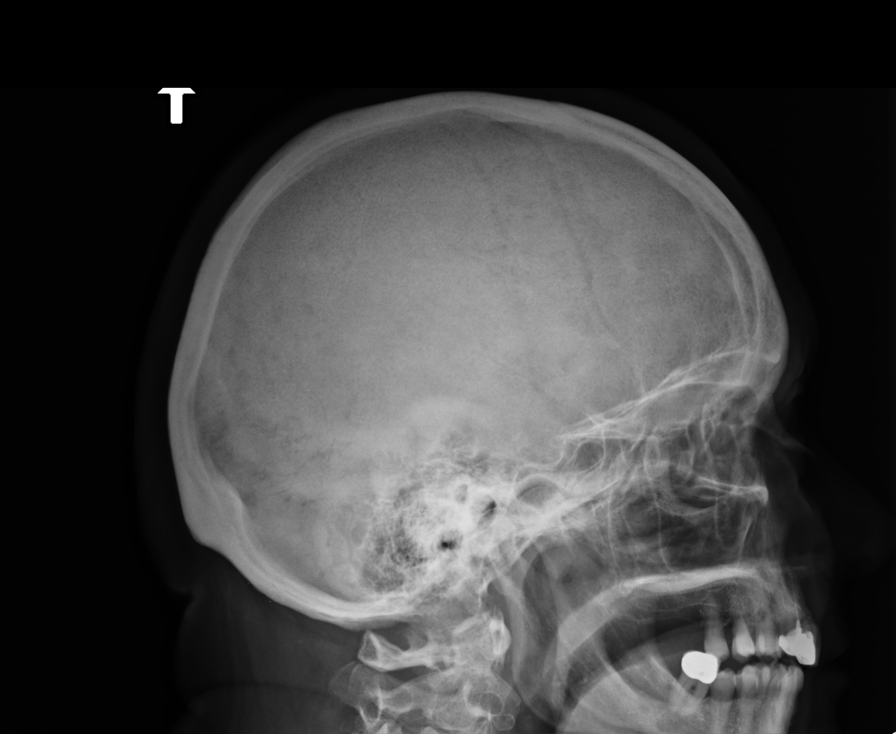
[im 4/18]
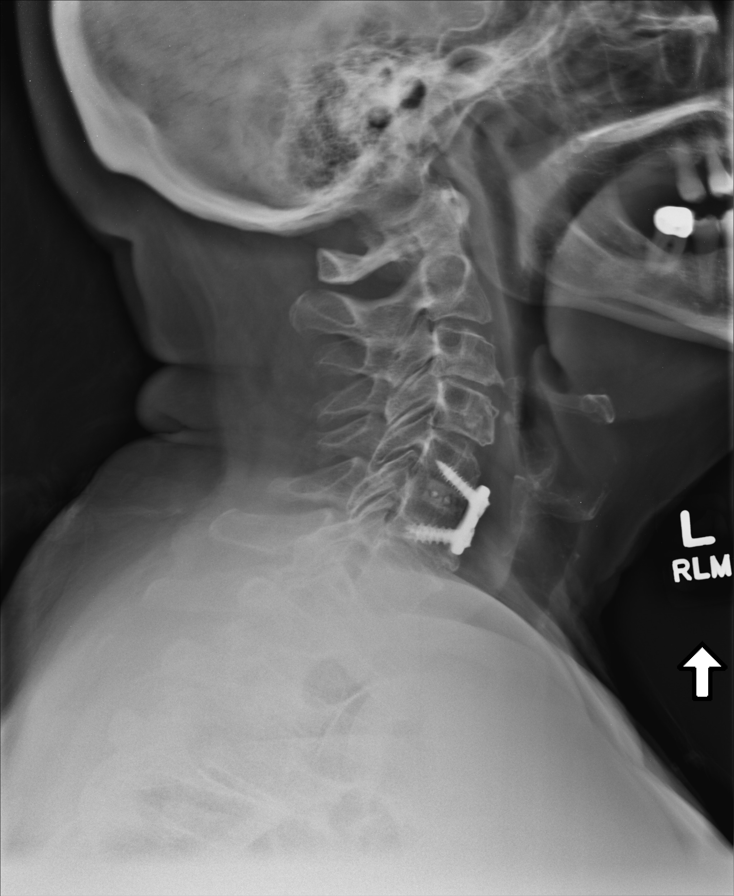
[im 6/18]
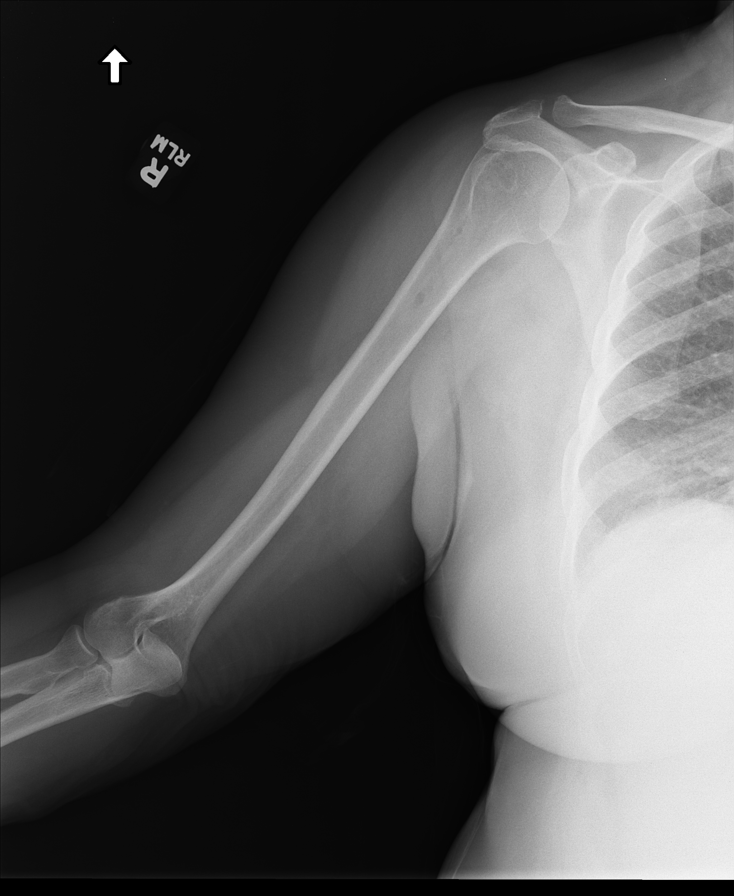
[im 8/18]
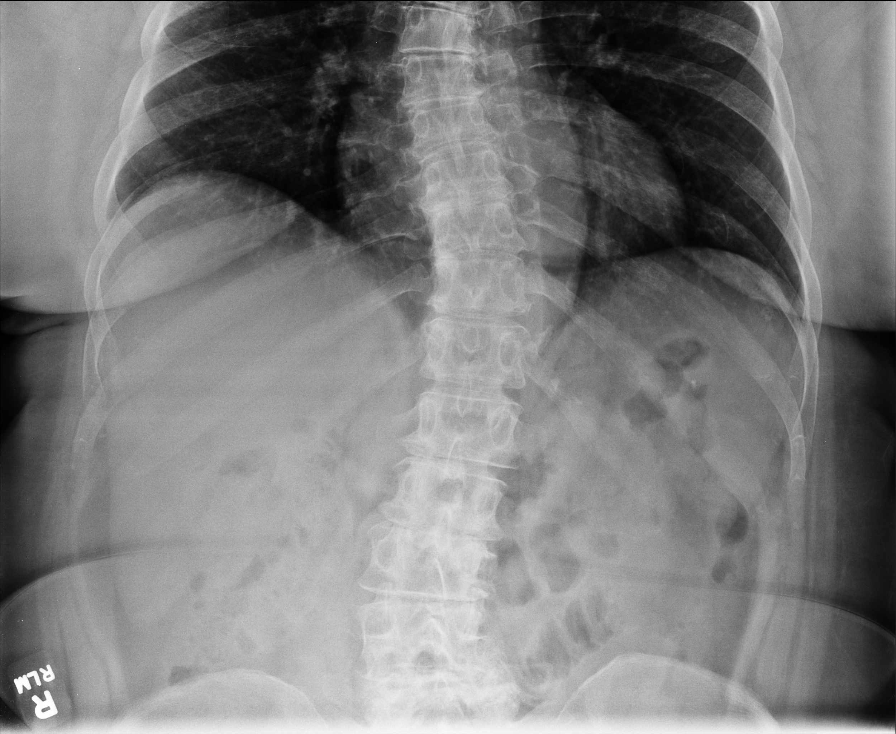
[im 10/18]
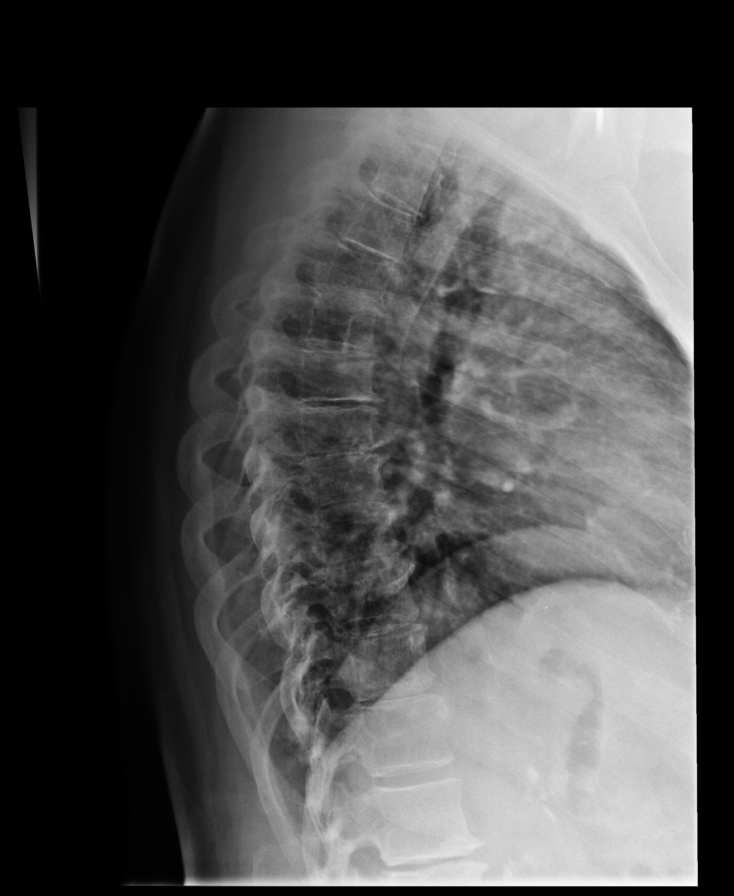
[im 12/18]
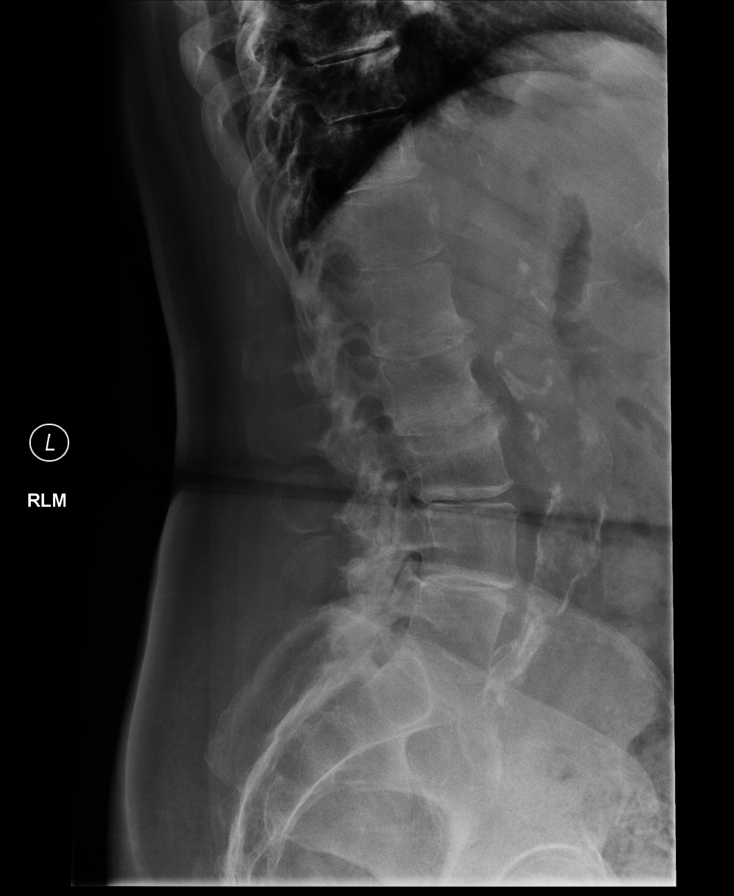
[im 14/18]
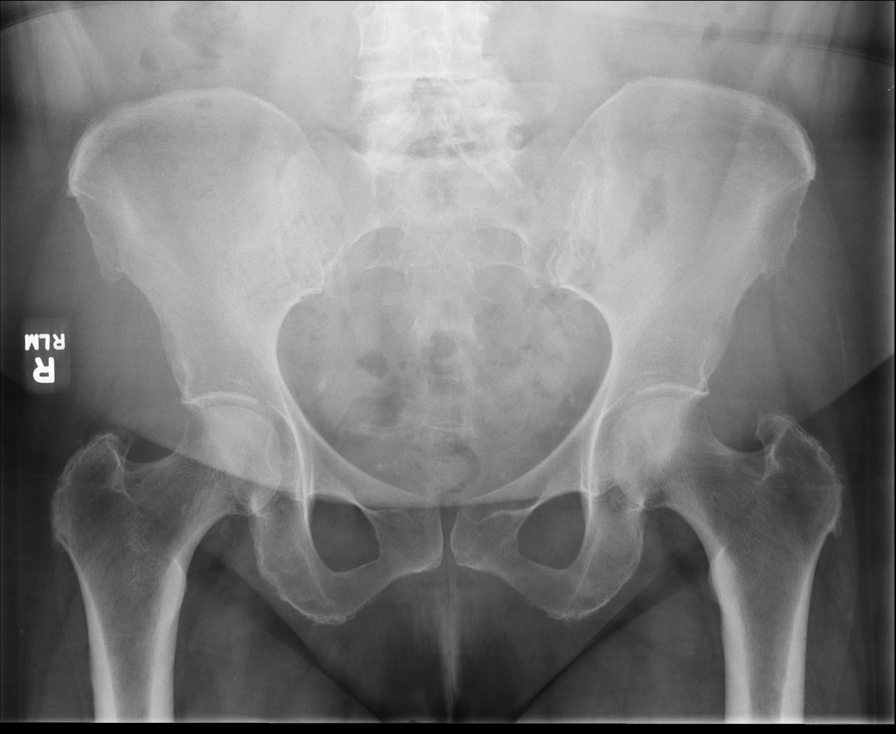

[8 of 10 positions shown; findings below may reference images not displayed]

FINDINGS: No osteolytic bone lesion. The degenerative arthritis in the cervical, thoracic 
and lumbar spine are present. Post op changes in the cervical spine with 
anterior fusion of C5 and C6 with anterior metallic plate and metallic screws. 
Disc disc spacer at C5/C6 this is also present.
IMPRESSION: No osteolytic bone lesion. Moderate degenerative arthritis in the cervical, 
thoracic and lumbar spine are present.

## 2019-02-09 IMAGING — DX SKELETAL SURVEY
1 series · 12 of 15 positions shown · non-contrast
Comparison: Skeletal survey January 07, 2018

SKELETAL SURVEY, 02/09/2019 [DATE]: 
CLINICAL INDICATION: Abnormal blood work, left arm pain
TECHNIQUE: Standard skeletal survey radiographs

[Series 1: PA · U · 0.12mm/px · 12 of 15 slices shown]
[im 1/15]
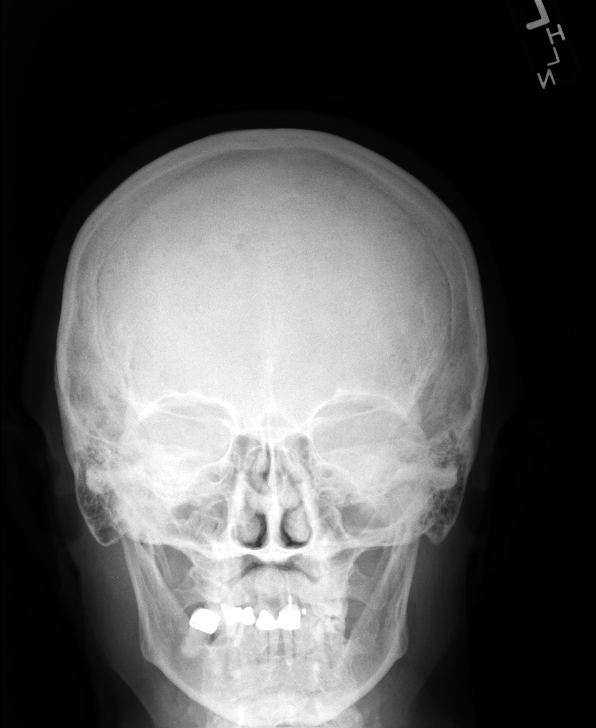
[im 2/15]
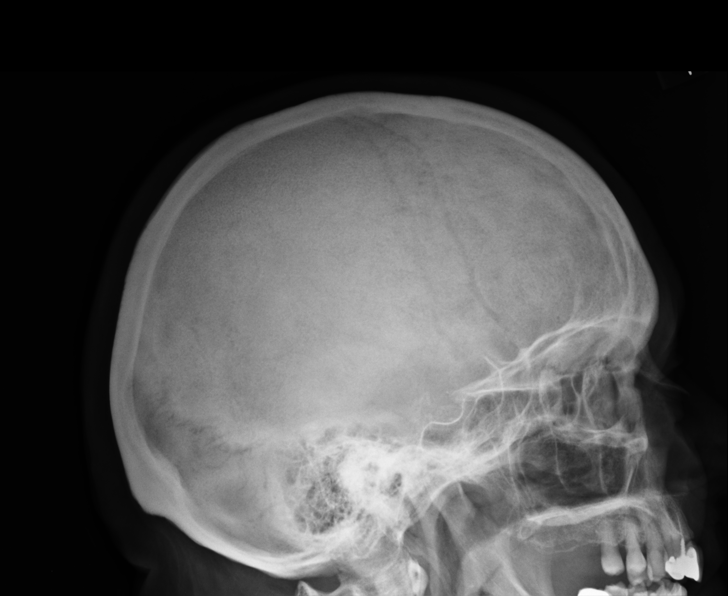
[im 4/15]
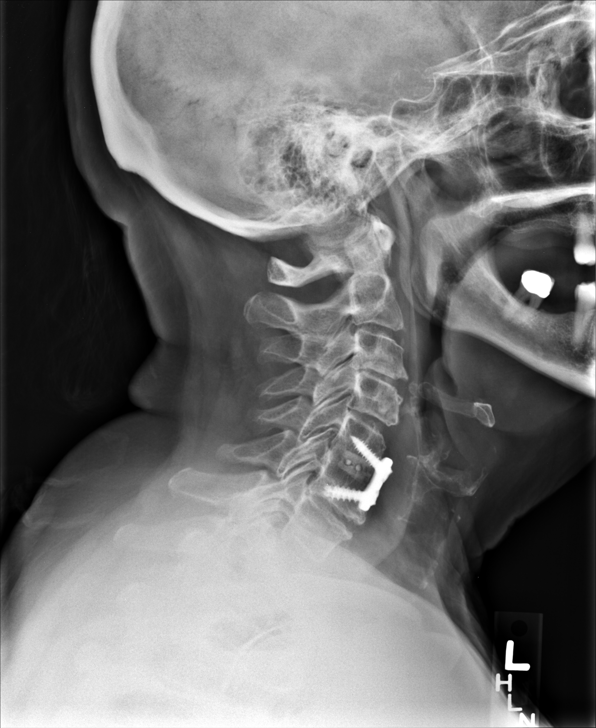
[im 5/15]
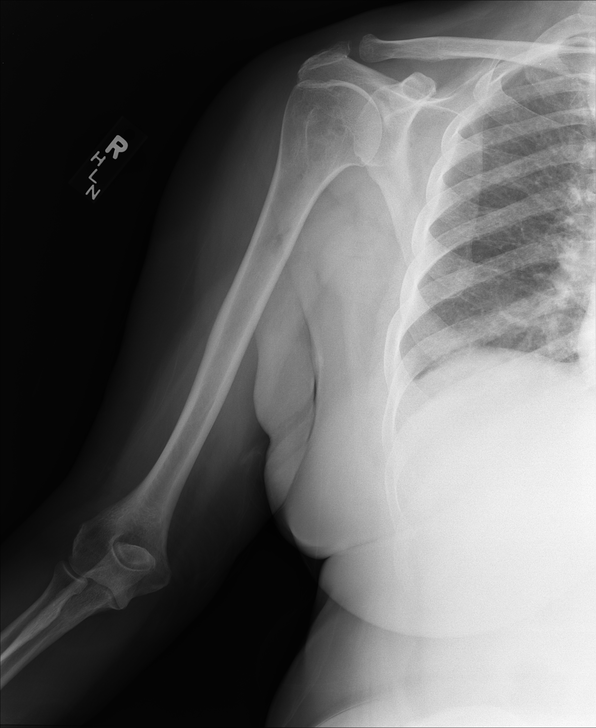
[im 6/15]
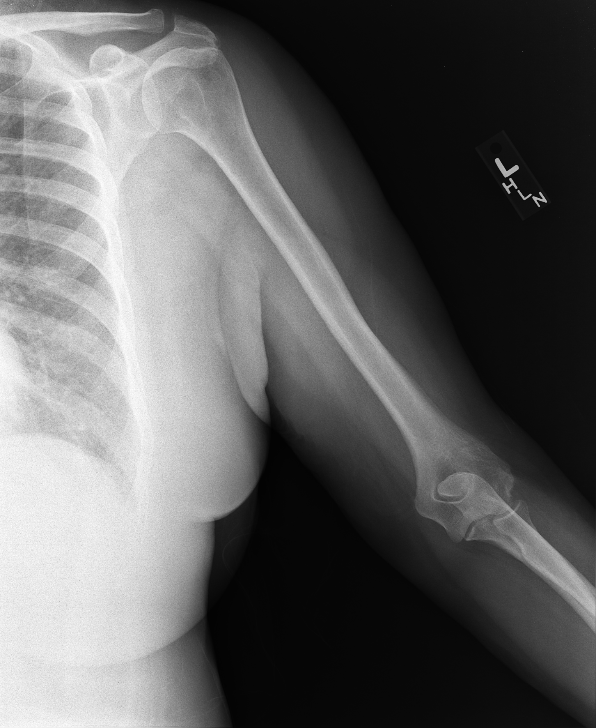
[im 7/15]
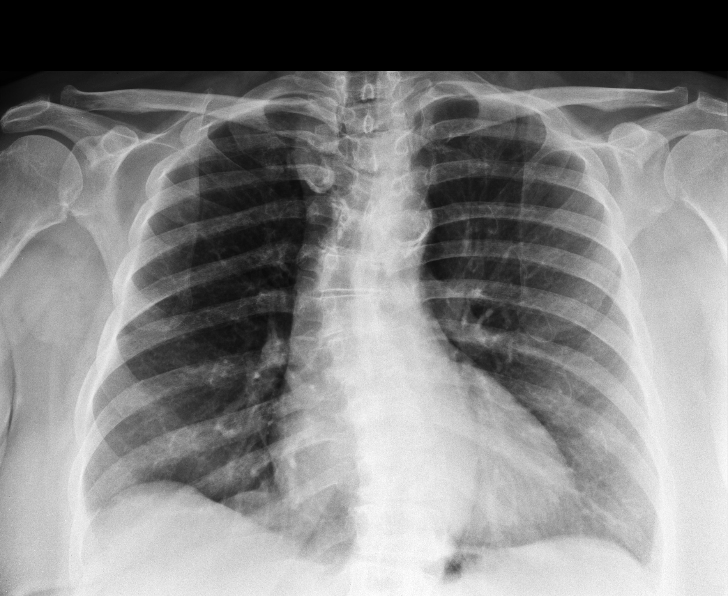
[im 9/15]
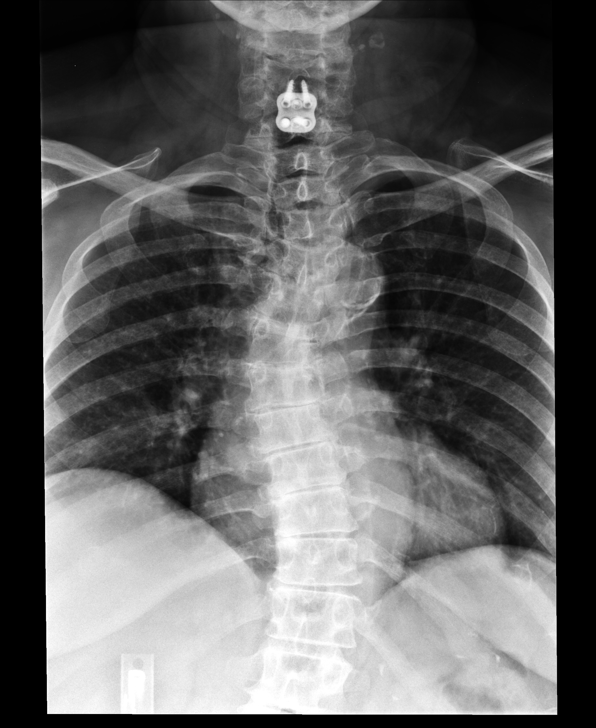
[im 10/15]
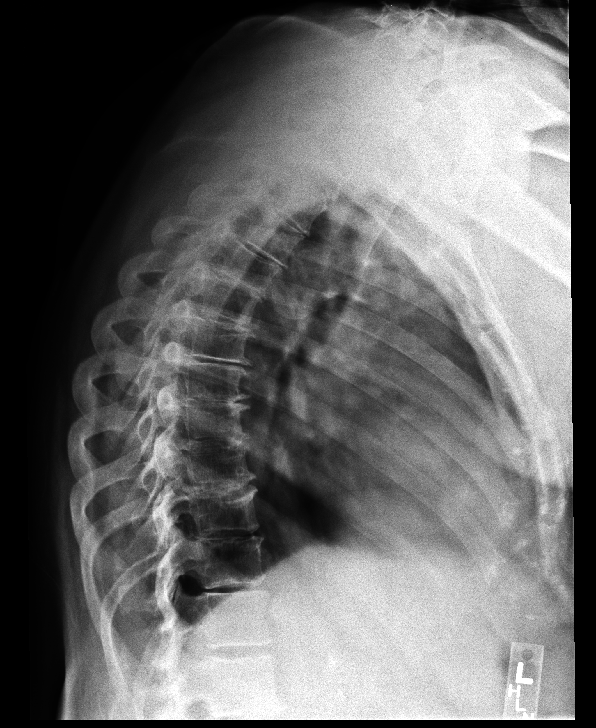
[im 11/15]
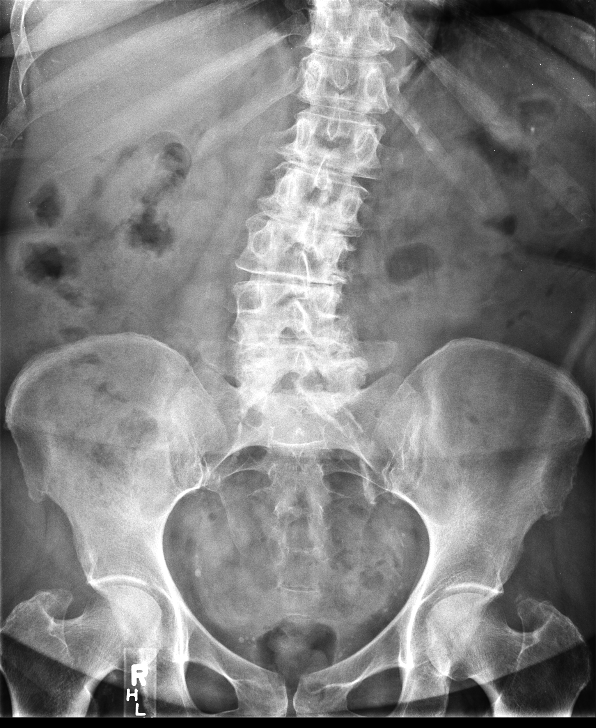
[im 12/15]
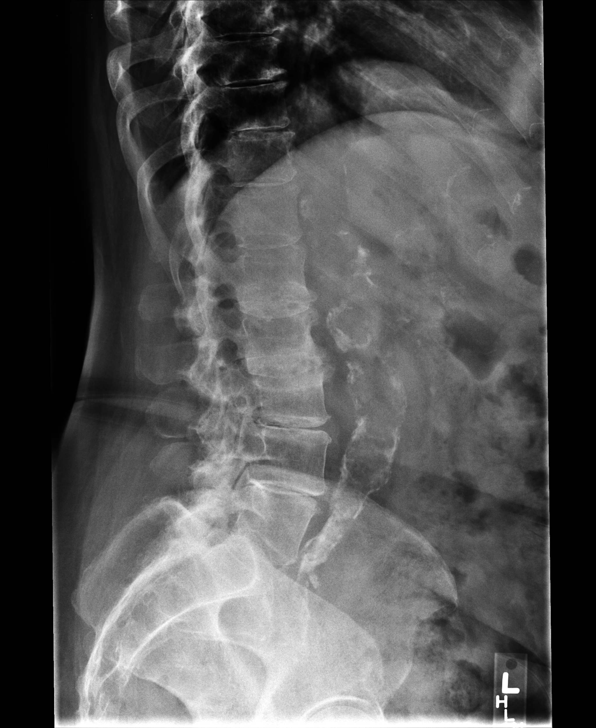
[im 14/15]
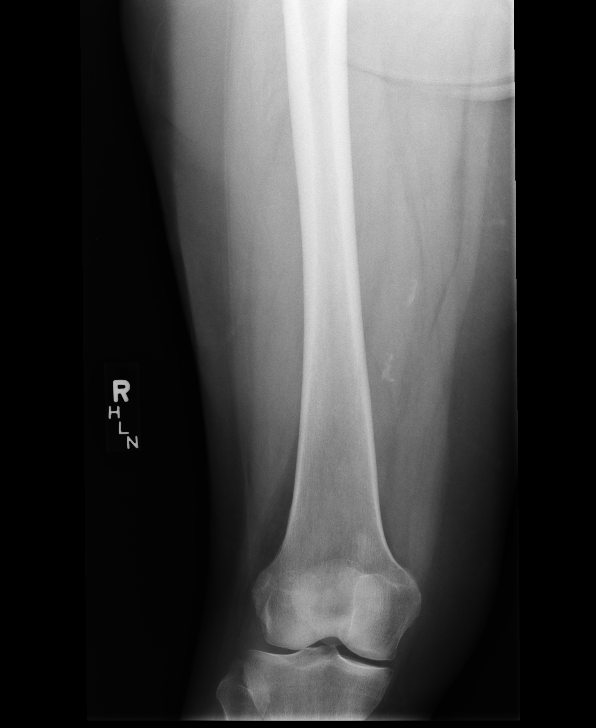
[im 15/15]
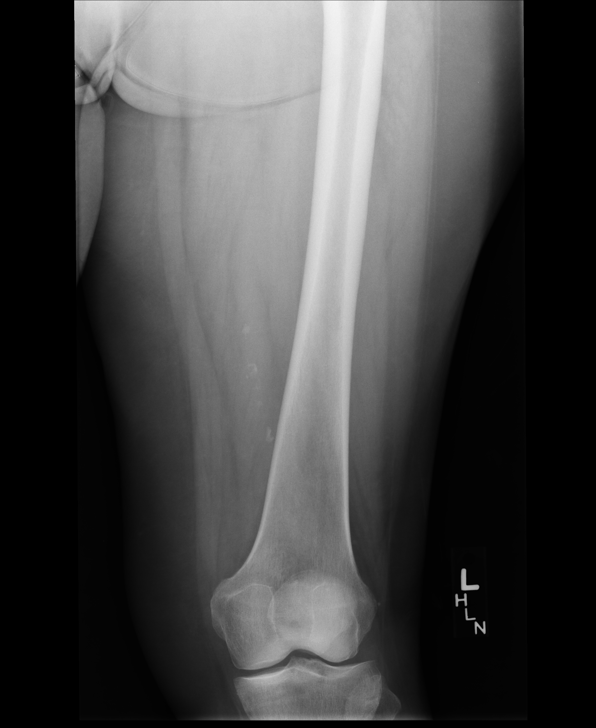

[12 of 15 positions shown; findings below may reference images not displayed]

FINDINGS: There are degenerative changes. There are small lucencies in the right 
humeral head and proximal right humeral diaphysis. Left humerus is shows no 
lytic lesion. Lungs are well-expanded and clear. There is scoliosis with 
degenerative changes in the thoracic and lumbar spine. There are 
arteriosclerotic changes There has been C5-6 ACDF with anterior plate and screw 
fusion.
IMPRESSION: Small lucencies in the proximal right humerus are similar to those seen on the 
December 2017 study, indeterminant, possibly indicating involvement by myeloma. 
No new lesion. The left humerus is unremarkable. 
Degenerative and postsurgical changes. Thoracolumbar scoliosis. 
Increased colonic stool could indicate constipation.

## 2019-05-24 IMAGING — CT CT LUNG SCREENING
1 of 3 series · 4 of 36 positions shown, 5 images · non-contrast
Comparison: There are no previous exams available for comparison.

CT LUNG SCREENING, 05/24/2019 [DATE]: 
CLINICAL INDICATION:   90 pack year smoking history in this 4-year-old female 
who is 5 foot 1 and weighs 174 pounds 
A search for DICOM formatted images was conducted for prior CT imaging studies 
completed at a non-affiliated media free facility.
TECHNIQUE: Screening computed tomographic images of the chest are performed from 
the thoracic outlet through the diaphragms without intravenous contrast 
utilizing dose reduction technique. The patient's dose for this study is
mGy.

[Series 5: coronal cor · coronal · 0.59mm/px · 4 of 131 slices shown, 5 images]
[im 27/131  mediastinal]
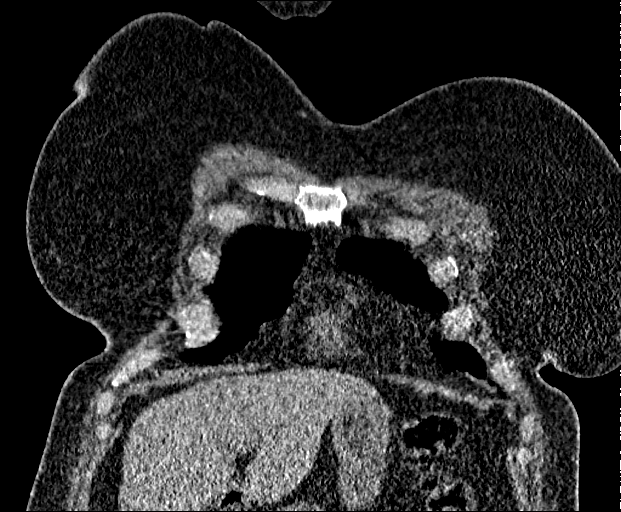
[im 27/131  lung]
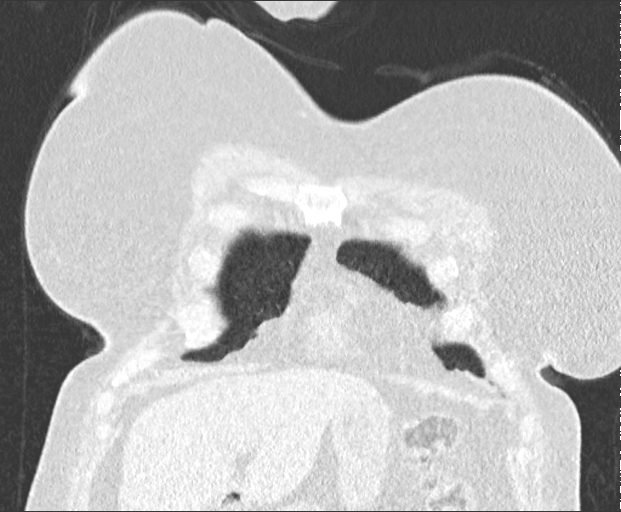
[im 53/131  lung]
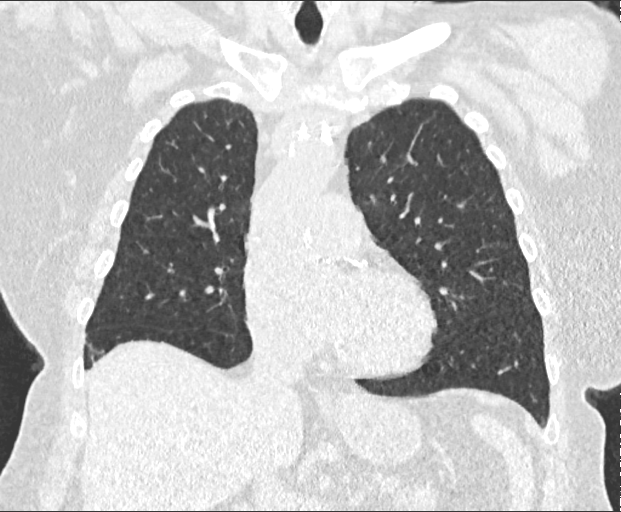
[im 79/131  lung]
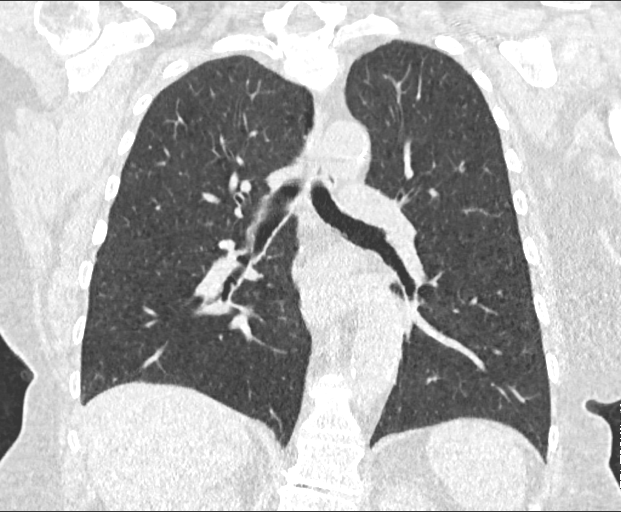
[im 105/131  lung]
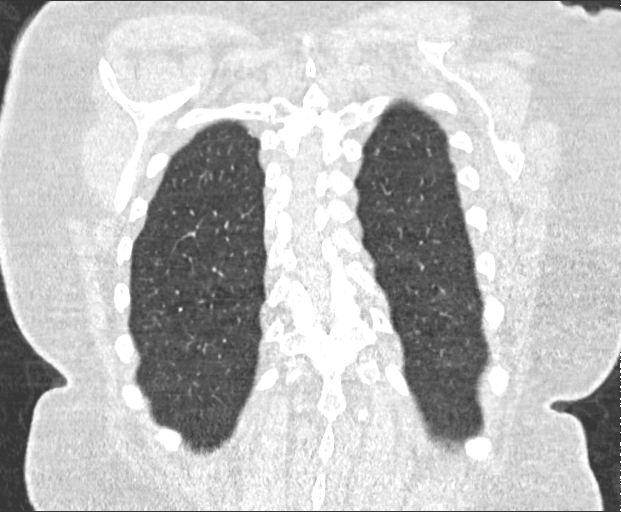

[4 of 36 positions shown; findings below may reference images not displayed]

FINDINGS: 
FINDINGS: LUNGS AND PLEURA:  3 mm noncalcified nodule lateral aspect left upper lobe 
abutting the major fissure. 2 mm micronodules are stable left lower lobe. 2 mm 
micronodule lateral anterior aspect right lower lobe.. 
LYMPH NODES:  No lymphadenopathy. 
MEDIASTINUM:  No mediastinal masses. 
HEART: Normal in size. No pericardial effusion. Moderate severe coronary artery 
calcification. Moderate severe mitral valve calcification. 
OSSEOUS STRUCTURES: Fixation plate and screws lower cervical spine. Mild 
peribronchial thickening. No acute consolidation or effusion. Scoliosis and 
degenerative change. No acute fracture or destructive lesions. 
UPPER ABDOMEN:  Small hiatal hernia.
IMPRESSION: 1.  Scattered micronodules, largest in the left upper lobe measures 3 mm. 
2.  No mediastinal or hilar adenopathy. 
L-RADS:  (2) Nodules with a very low likelihood of becoming a clinically active 
cancer due to size or lack of growth.  Continue annual screening with low dose 
CT in 12 months. 
RADIATION DOSE REDUCTION: All CT scans are performed using radiation dose 
reduction techniques, when applicable.  Technical factors are evaluated and 
adjusted to ensure appropriate moderation of exposure.  Automated dose 
management technology is applied to adjust the radiation doses to minimize 
exposure while achieving diagnostic quality images.

## 2019-08-11 IMAGING — DX HIP BILATERAL WITH PELVIS
2 series · 5 of 5 positions shown · non-contrast
Comparison: None

HIP BILATERAL WITH PELVIS, 08/11/2019 [DATE]: 
CLINICAL INDICATION: Bilateral hip pain

[AP (1 of 2)]
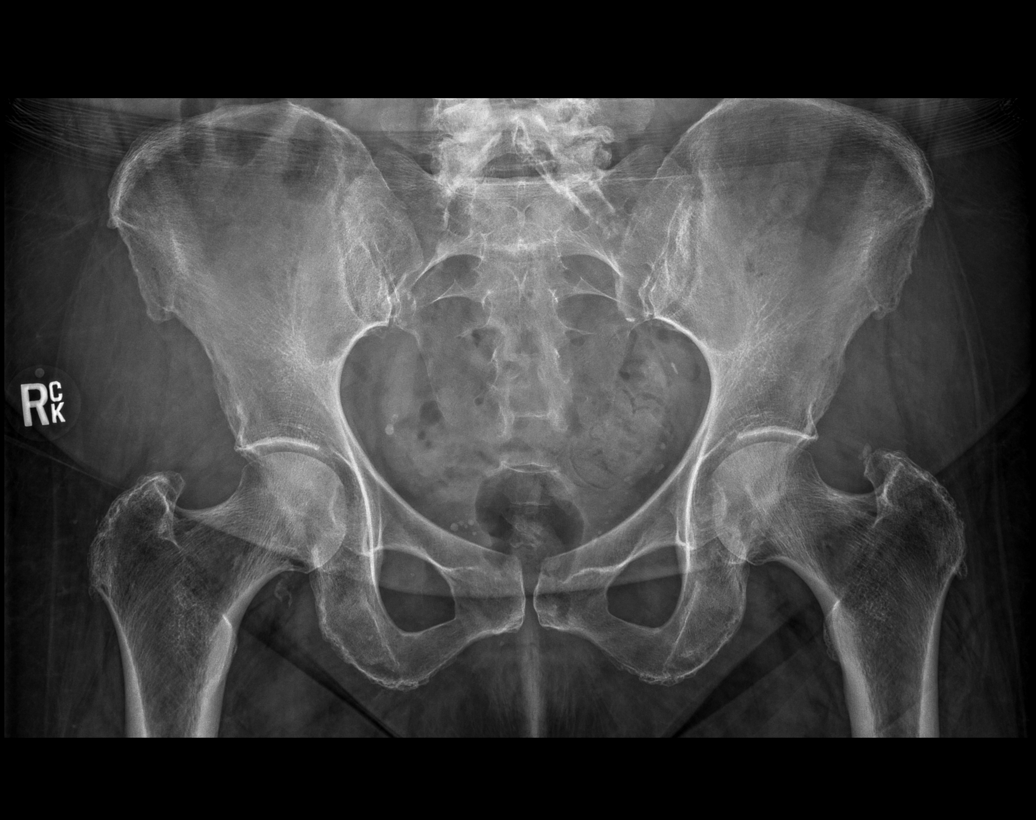

[Series 2: AP · 0.14mm/px · 4 of 4 slices shown (2 of 2)]
[im 1/4]
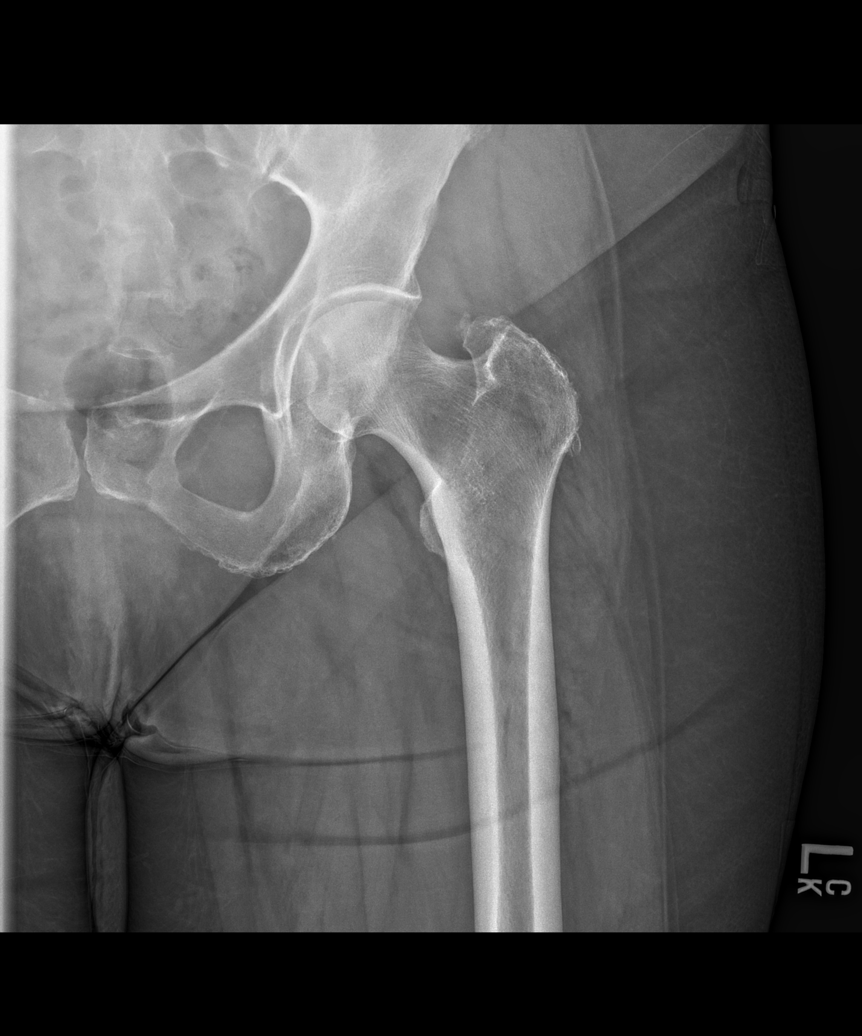
[im 2/4]
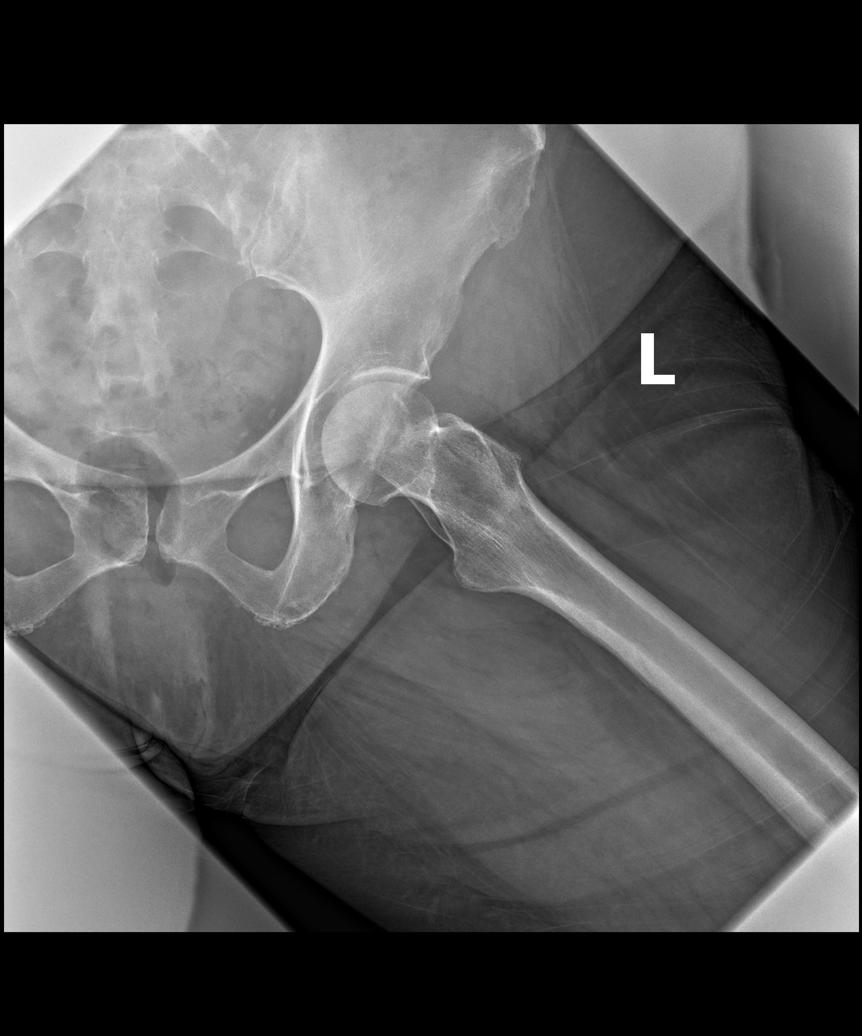
[im 3/4]
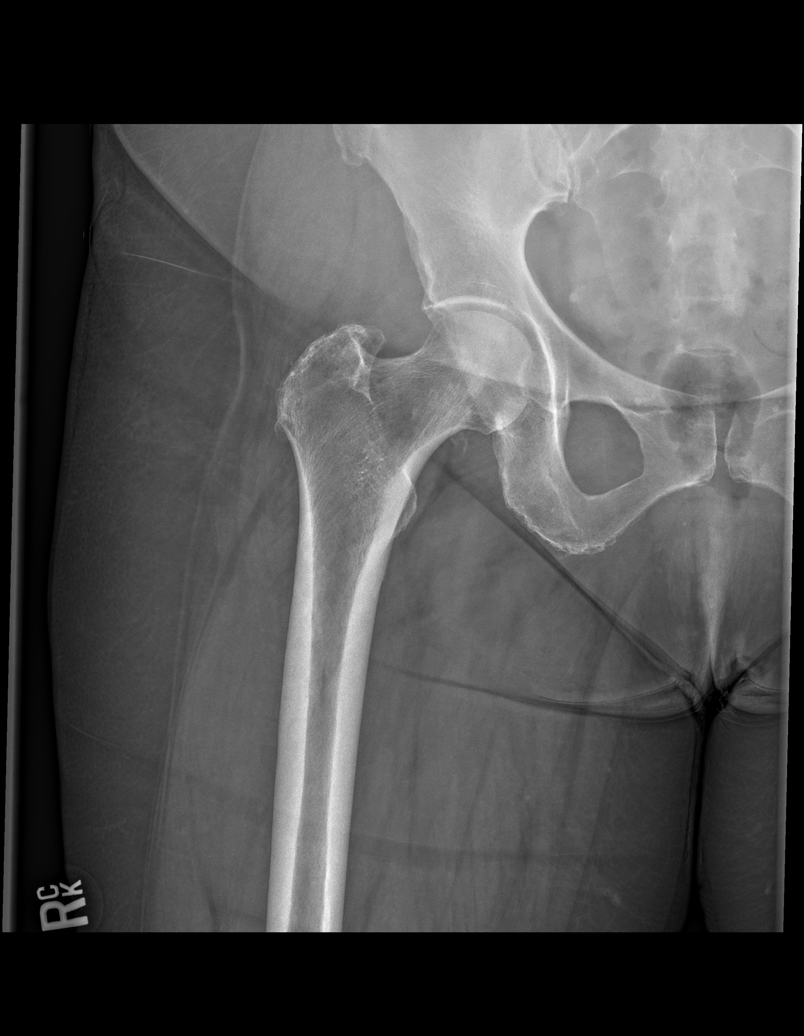
[im 4/4]
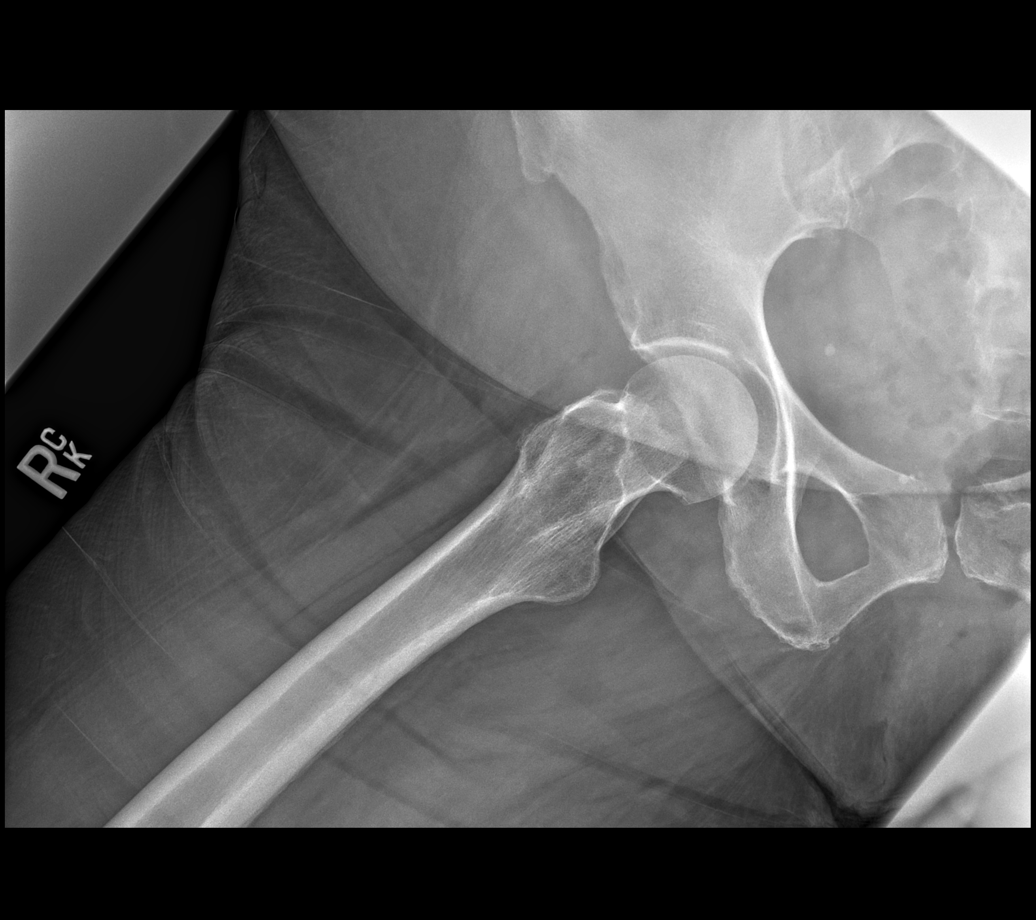

[5 of 5 positions shown; findings below may reference images not displayed]

FINDINGS: There are mild hip degenerative changes. There is relatively good preservation 
of hip cartilage height. There is no fracture, dislocation or osteonecrosis. 
There are mild abductor cuff degenerative changes. There is no pelvic fracture. 
No bone destruction. Mild SI joint degenerative changes. Iliac artery plaque.
IMPRESSION: Mild hip degenerative changes, including mild abductor cuff tendinopathy. If 
indicated MRI of the hip/pelvis can be obtained for further evaluation.

## 2020-02-15 IMAGING — DX SKELETAL SURVEY
5 series · 9 of 10 positions shown · non-contrast
Comparison: 02/09/2019

SKELETAL SURVEY, 02/15/2020 [DATE]: 
CLINICAL INDICATION: Monoclonal gammopathy.

[Series 2: AP · 0.14mm/px · 2 of 2 slices shown (1 of 5)]
[im 1/2]
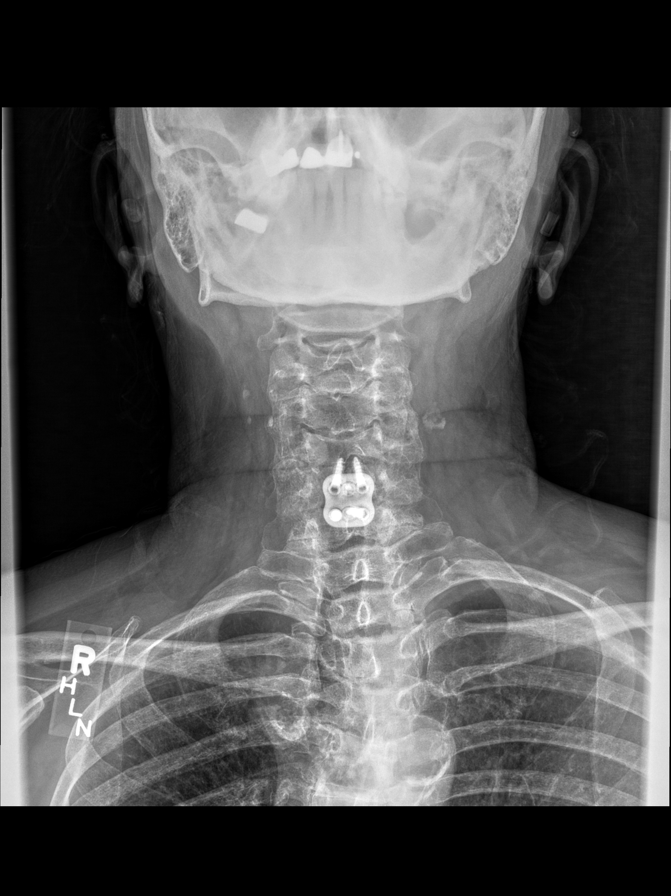
[im 2/2]
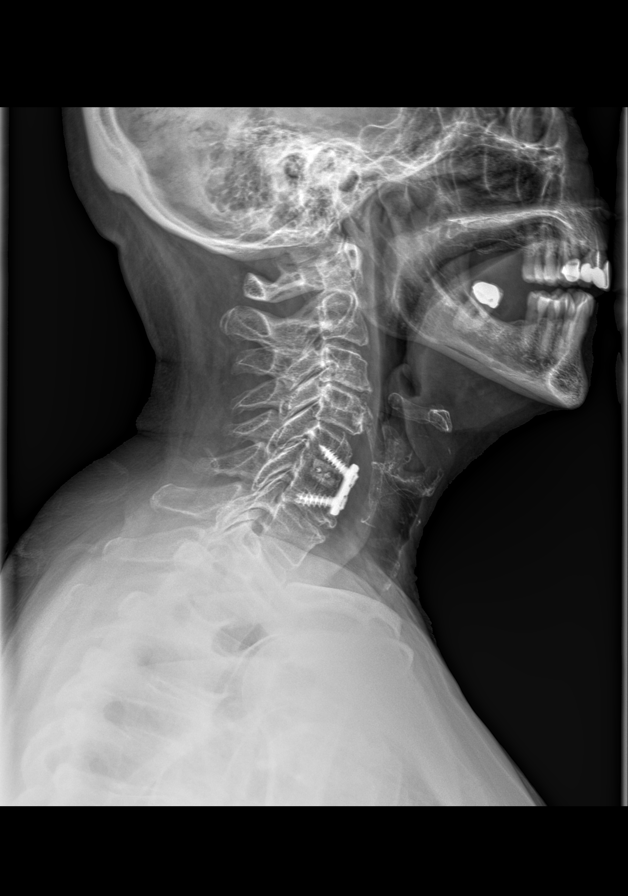

[Series 3: AP · 0.14mm/px · 3 of 3 slices shown (2 of 5)]
[im 1/3]
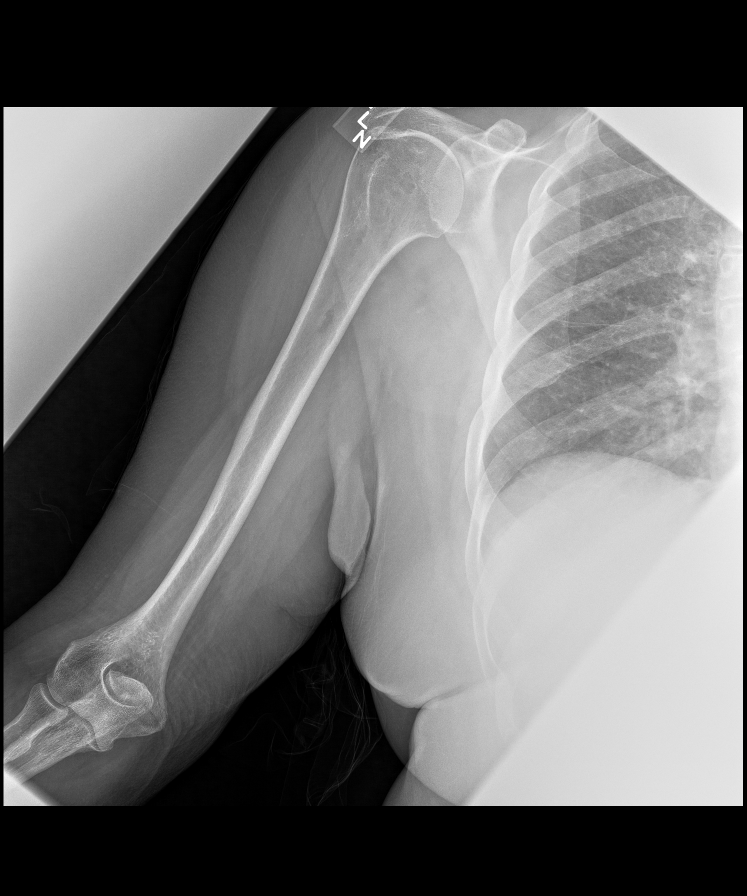
[im 2/3]
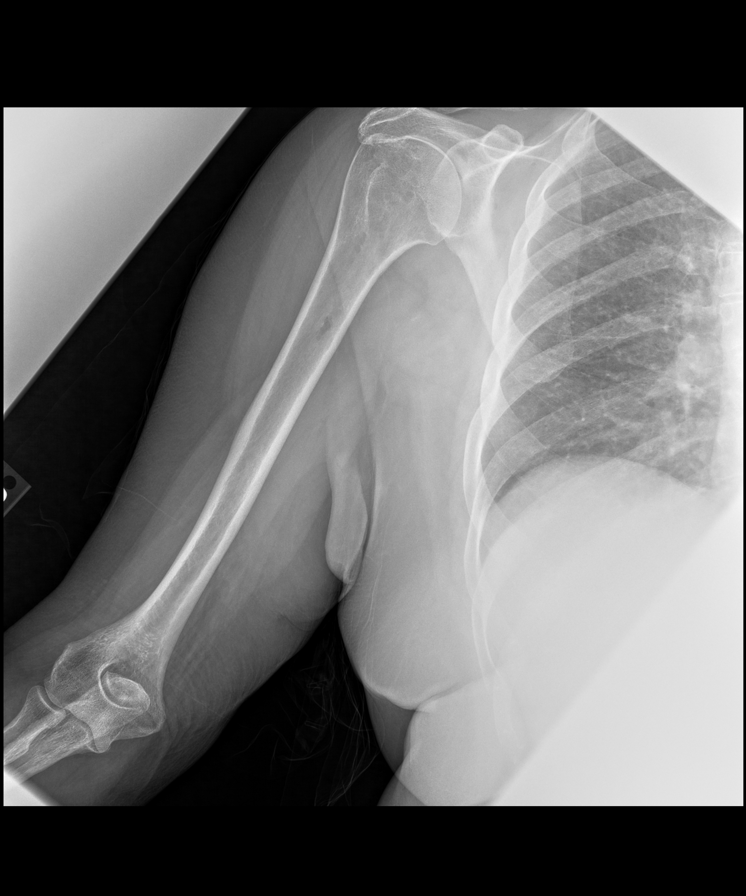
[im 3/3]
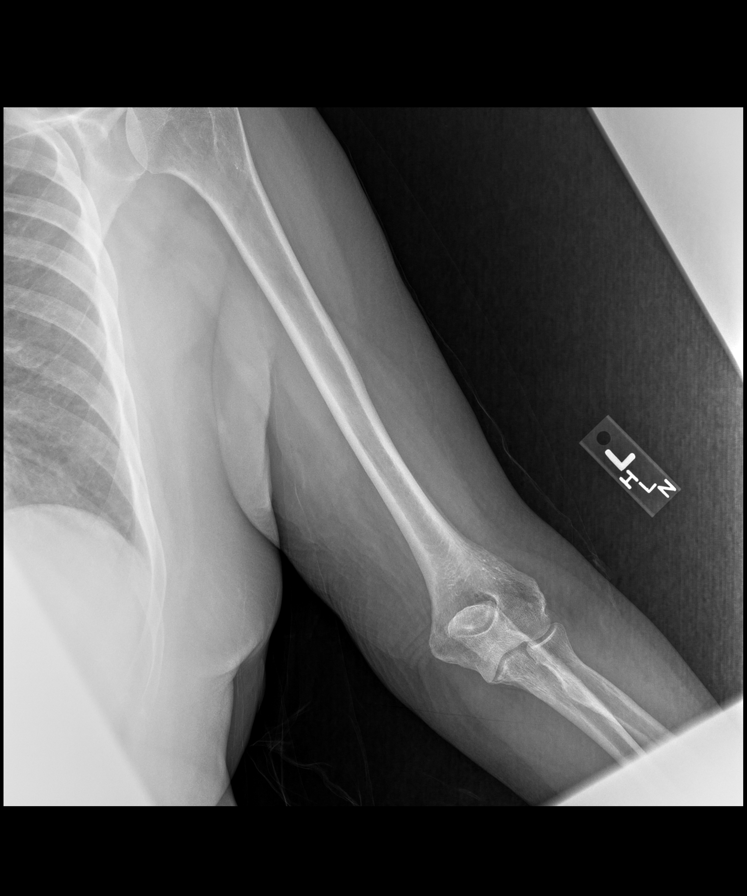

[Series 6: AP · 0.14mm/px · 2 of 2 slices shown (3 of 5)]
[im 1/2]
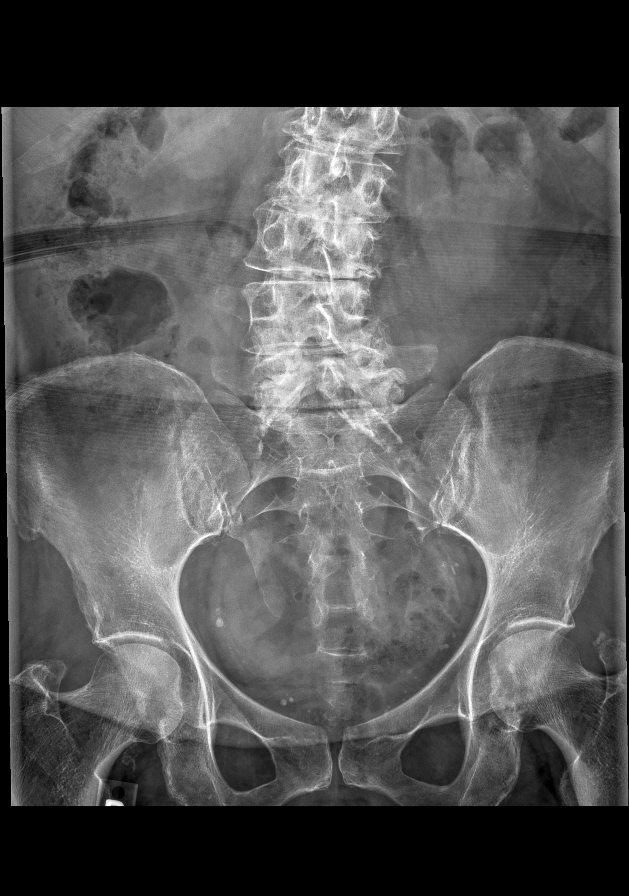
[im 2/2]
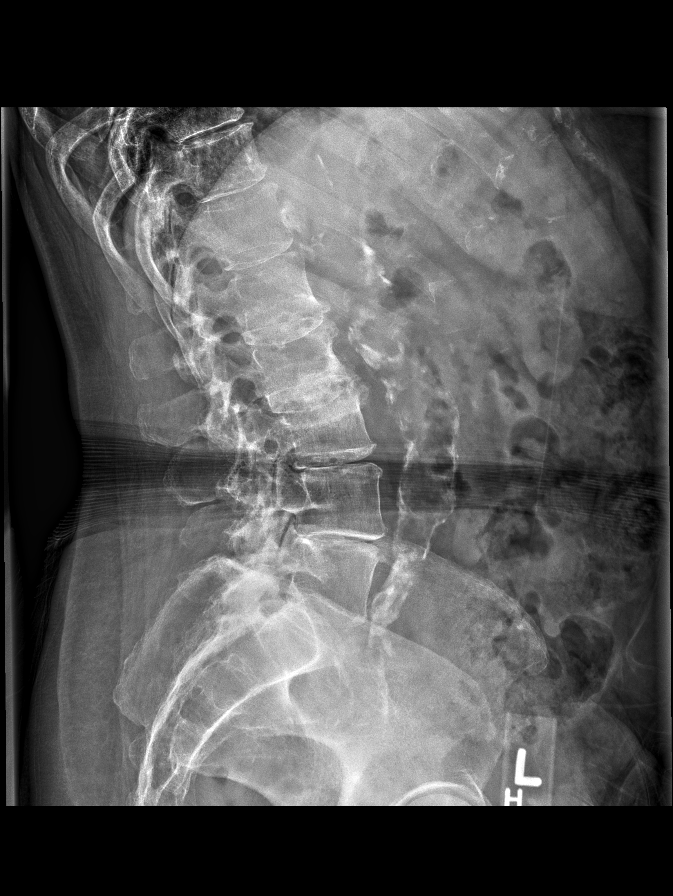

[AP (4 of 5)]
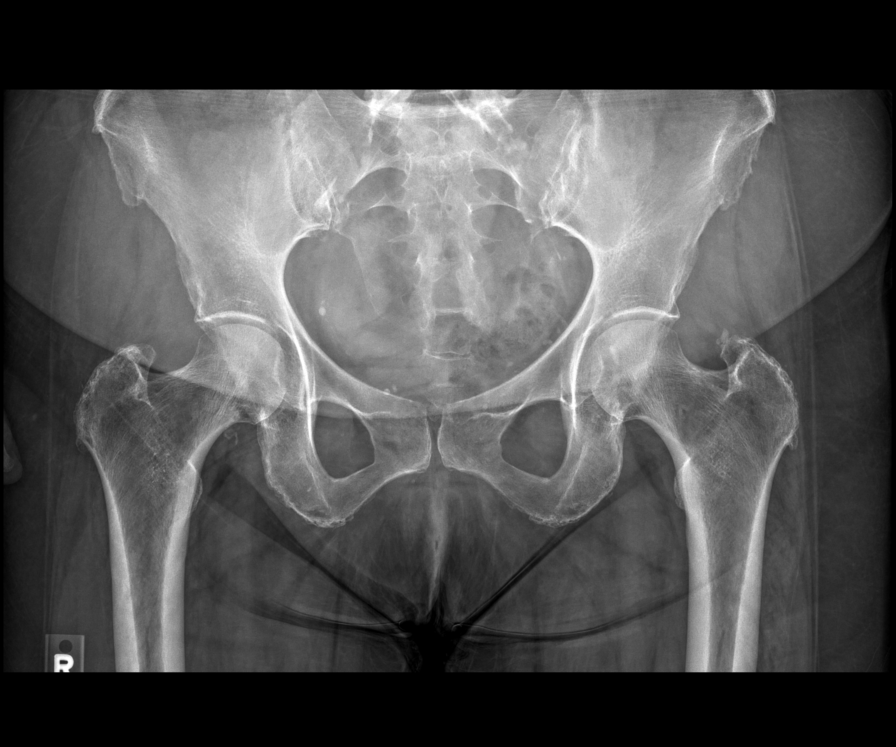

[AP (5 of 5)]
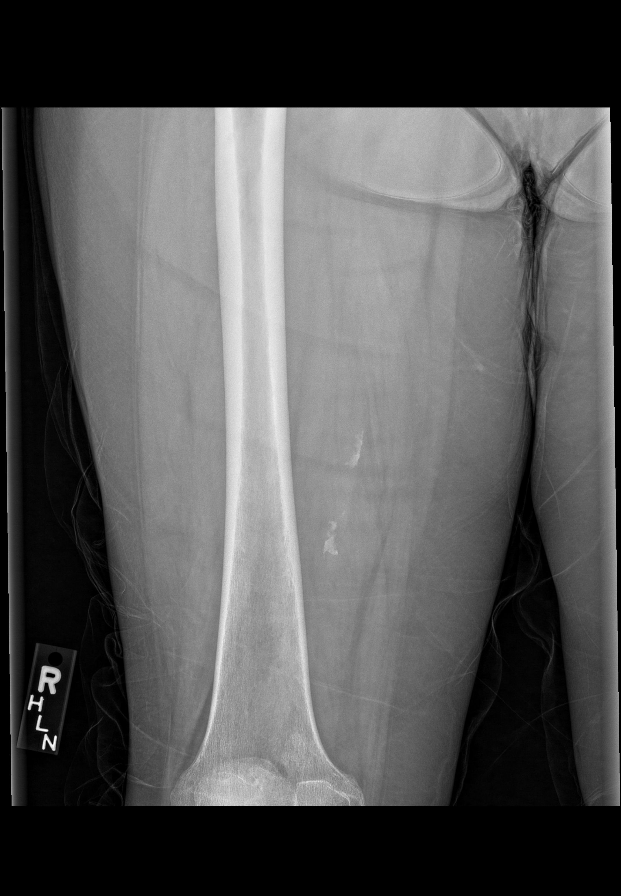

[9 of 10 positions shown; findings below may reference images not displayed]

FINDINGS: No lytic or blastic lesions. No pathologic fractures. Multifocal 
degenerative change. S-shaped thoracolumbar scoliosis. C5-6 ACDF with solid 
fusion. Dental hardware and several absent teeth. Osteopenia. Atherosclerosis.
IMPRESSION: No lytic or blastic lesions.

## 2020-04-11 IMAGING — MG MAMMOGRAPHY SCREENING BILATERAL 3D TOMOSYNTHESIS WITH CAD
7 series · 8 of 19 positions shown · non-contrast
Comparison: Comparison was made to prior examinations.

MAMMOGRAPHY SCREENING BILATERAL 3D TOMOSYNTHESIS WITH CAD, 04/11/2020 [DATE]: 
CLINICAL INDICATION: Screening exam.
TECHNIQUE: Digital bilateral mammograms and 3-D Tomosynthesis were obtained. 
These were interpreted both primarily and with the aid of computer-aided 
detection system. 
BREAST DENSITY: (Level B) There are scattered areas of fibroglandular density.

[R CC]
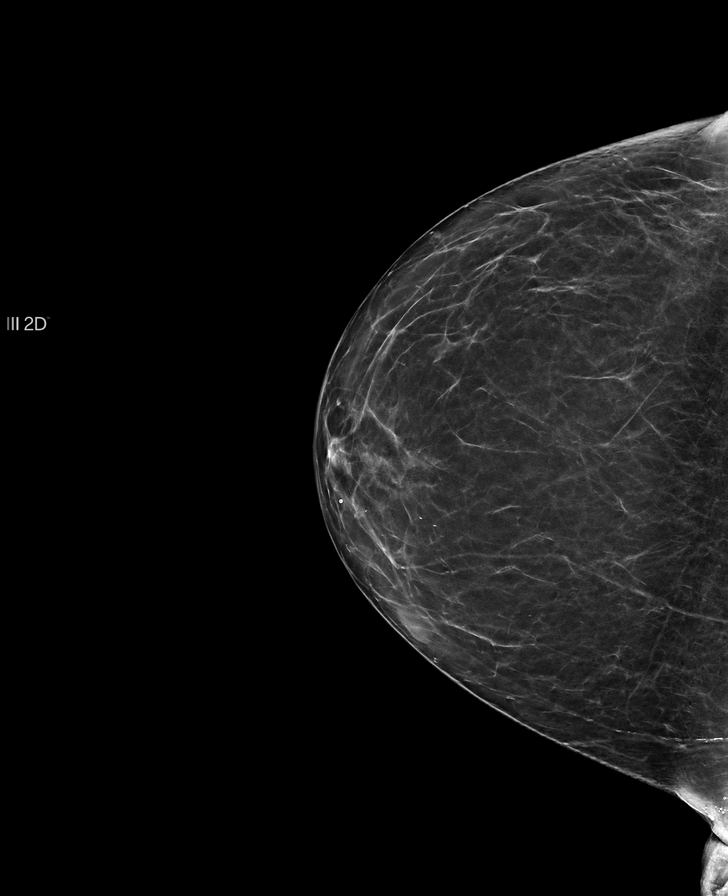

[R MLO]
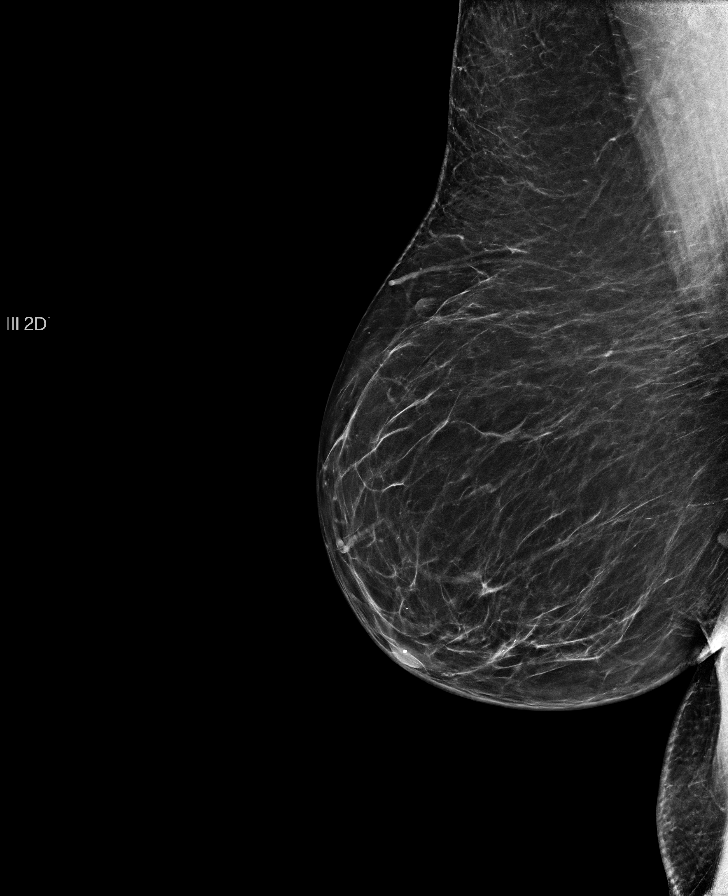

[L MLO]
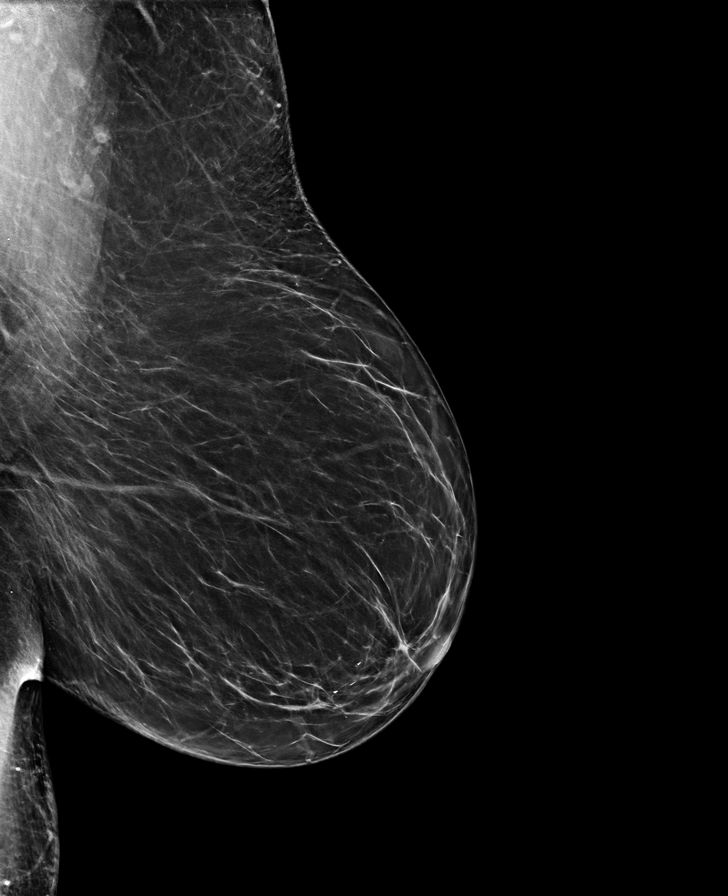

[L CC]
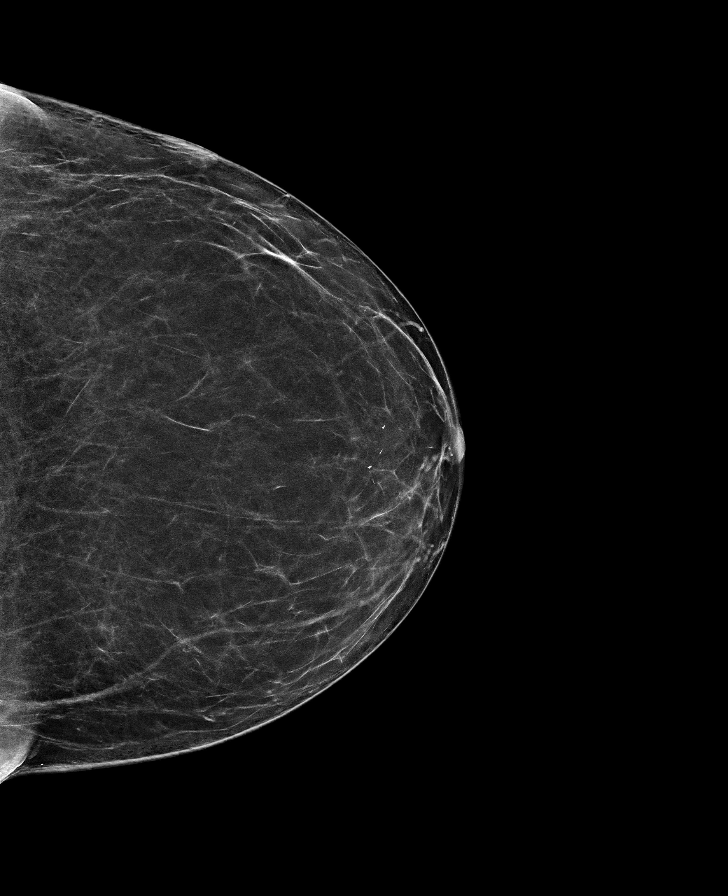

[L CC tomo · 2 of 65 frames shown]
[frame 21/65]
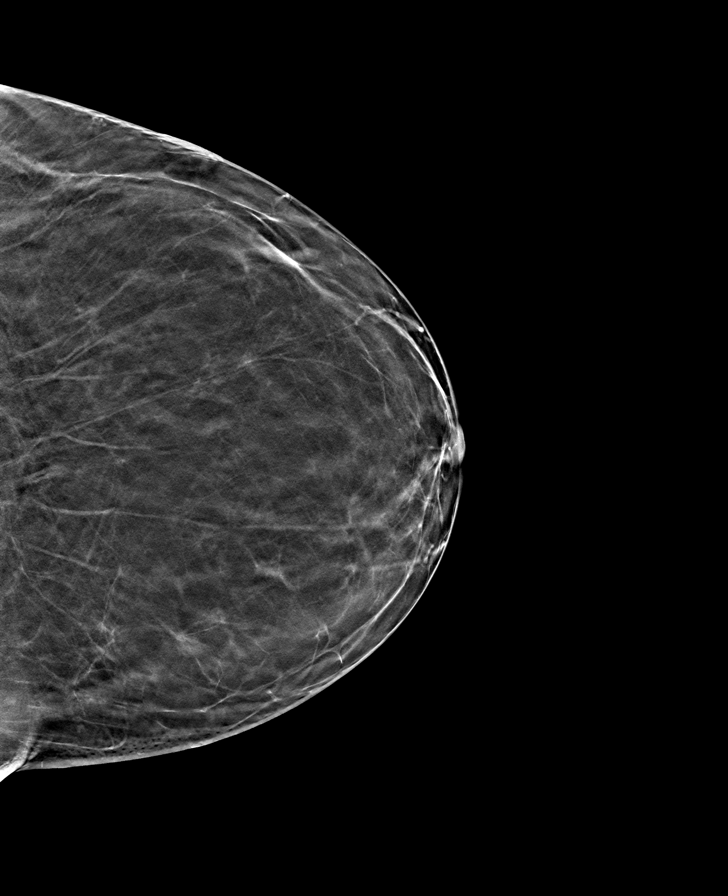
[frame 33/65]
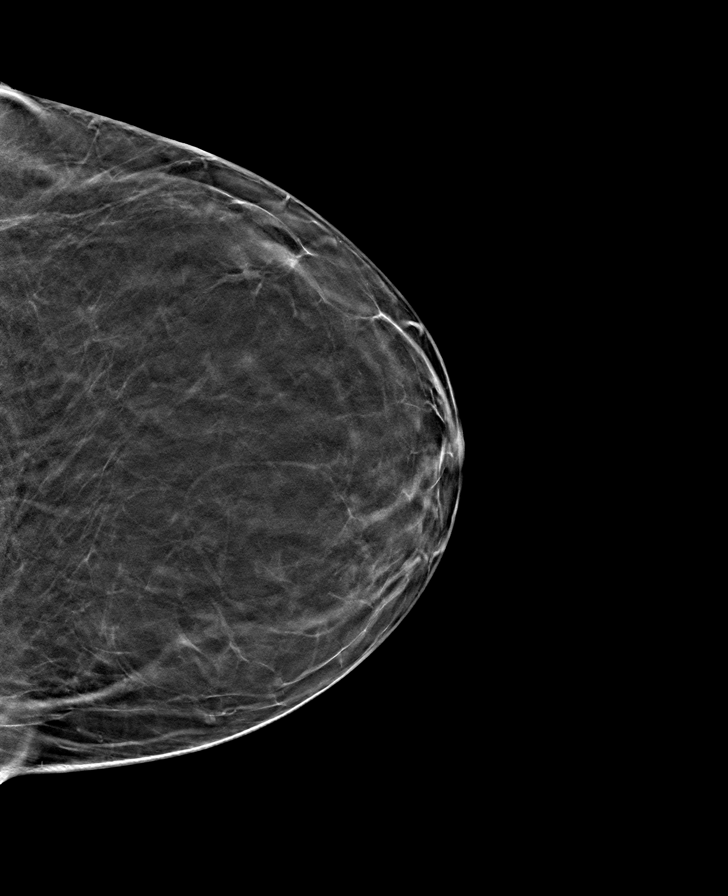

[R MLO tomo · tomo slice 35/69.0]
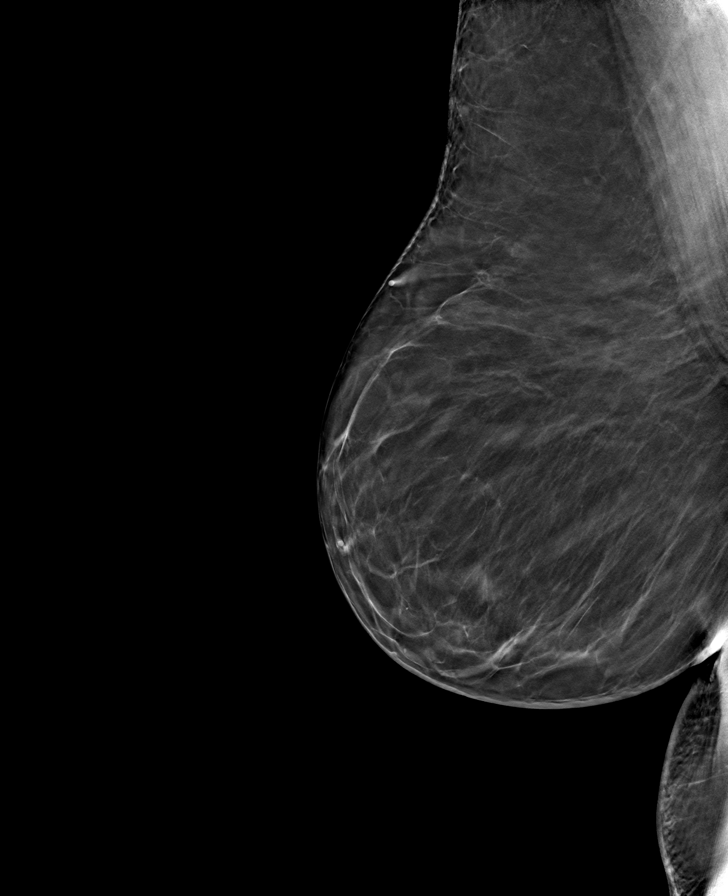

[R CC tomo · tomo slice 29/58.0]
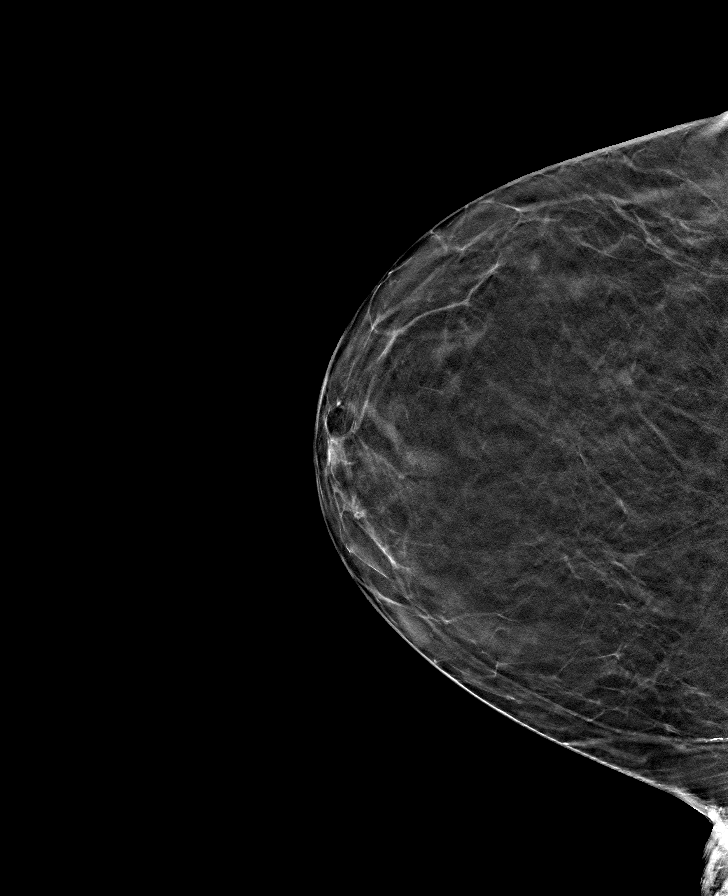

[8 of 19 positions shown; findings below may reference images not displayed]

FINDINGS: No suspicious mass, calcifications, or area of architectural 
distortion in either breast.
IMPRESSION: No mammographic evidence of malignancy in either breast. 
( BI-RADS 1) Negative mammogram. Routine mammographic follow-up is recommended.

## 2020-05-31 IMAGING — CT CT LUNG SCREENING
1 of 3 series · 4 of 36 positions shown, 5 images · non-contrast
Comparison: There are no prior exam(s) available for comparison within the past 
12 months; however, comparison was made to the prior exam(s) 05/24/2019

CT LUNG SCREENING, 05/31/2020 [DATE]: 
CLINICAL INDICATION:   45 pack year smoking history. Patient is a former smoker. 
A search for DICOM formatted images was conducted for prior CT imaging studies 
completed at a non-affiliated media free facility.
TECHNIQUE: Screening computed tomographic images of the chest are performed from 
the thoracic outlet through the diaphragms without intravenous contrast 
utilizing dose reduction technique. The patient's dose for this study is
mGy.

[Series 4: coronal · coronal · 0.60mm/px · 4 of 154 slices shown, 5 images]
[im 31/154  mediastinal]
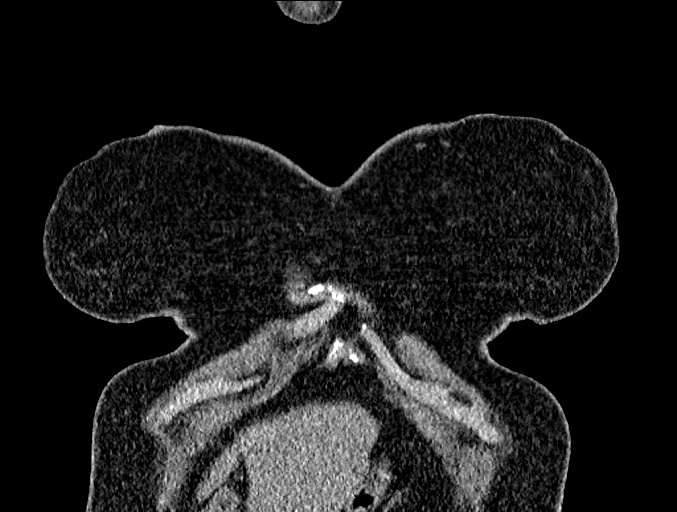
[im 31/154  lung]
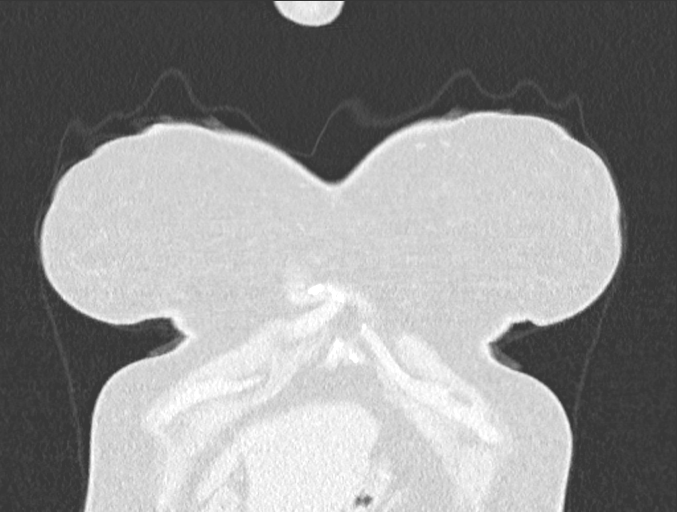
[im 62/154  lung]
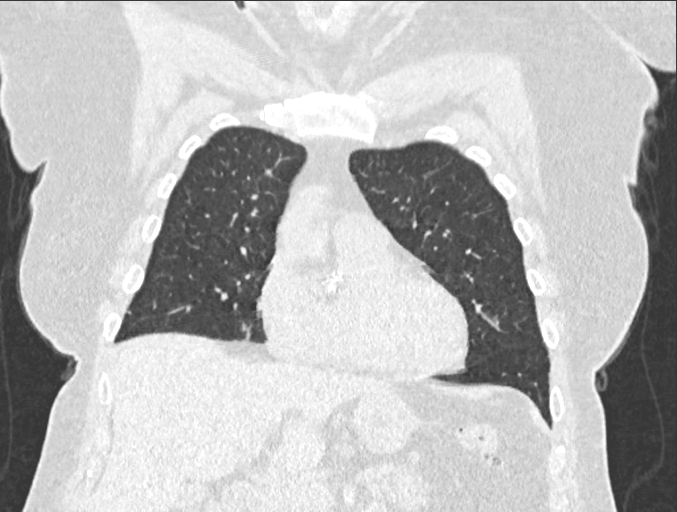
[im 92/154  lung]
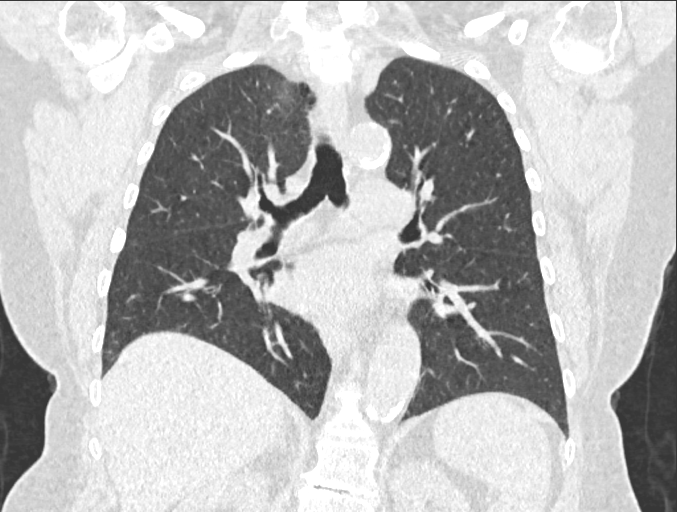
[im 123/154  lung]
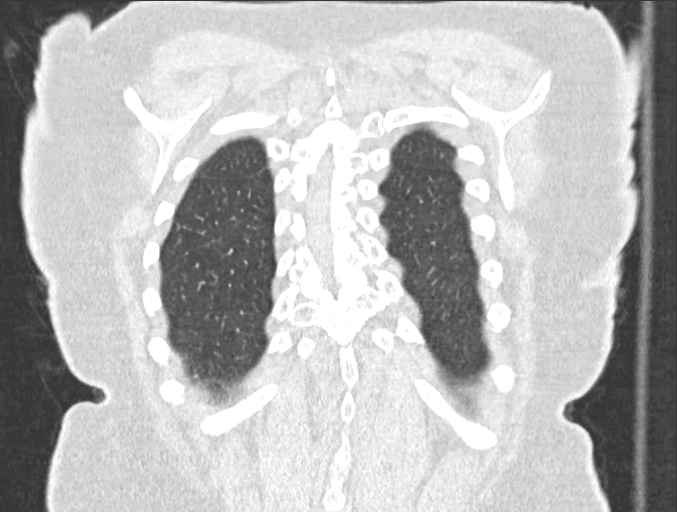

[4 of 36 positions shown; findings below may reference images not displayed]

FINDINGS: LUNGS AND PLEURA: Few stable scattered lower lobe noncalcified nodules measure 
up to 3.7 x 2.1 mm in the LLL (sequence 3 image 119). Mild centrilobular 
emphysema. Minimal scattered fibrosis. 
LYMPH NODES:  No lymphadenopathy. 
MEDIASTINUM:  No mediastinal masses. Atherosclerosis. 
HEART: Normal in size. No pericardial effusion. Marked coronary arterial 
calcifications. 
OSSEOUS STRUCTURES:  No acute fracture or destructive lesions. Degenerative 
change. Rotatory levoscoliosis. Cervical ACDF. 
UPPER ABDOMEN:  Small hiatal hernia.
IMPRESSION: 1.  Few stable scattered lower lobe noncalcified nodules (up to 3.7 x 2.1 mm). 
2.  Mild centrilobular emphysema. Minimal scattered fibrosis. 
3.  Atherosclerosis and marked coronary arterial calcifications. 
4.  Degenerative change, rotatory levoscoliosis and cervical ACDF. 
5.  Small hiatal hernia. 
L-RADS: Category 2 (benign appearance, <1% chance of malignancy) 
Category 2: continue annual screening with LDCT 
RADIATION DOSE REDUCTION: All CT scans are performed using radiation dose 
reduction techniques, when applicable.  Technical factors are evaluated and 
adjusted to ensure appropriate moderation of exposure.  Automated dose 
management technology is applied to adjust the radiation doses to minimize 
exposure while achieving diagnostic quality images.

## 2021-02-14 IMAGING — DX SKELETAL SURVEY, COMPLETE
8 series · 9 of 9 positions shown · non-contrast
Comparison: 02/15/2020

________________________________________________________________________________________________ 
SKELETAL SURVEY, COMPLETE, 02/14/2021 [DATE]: 
CLINICAL INDICATION: Monoclonal gammopathy.

[lateral]
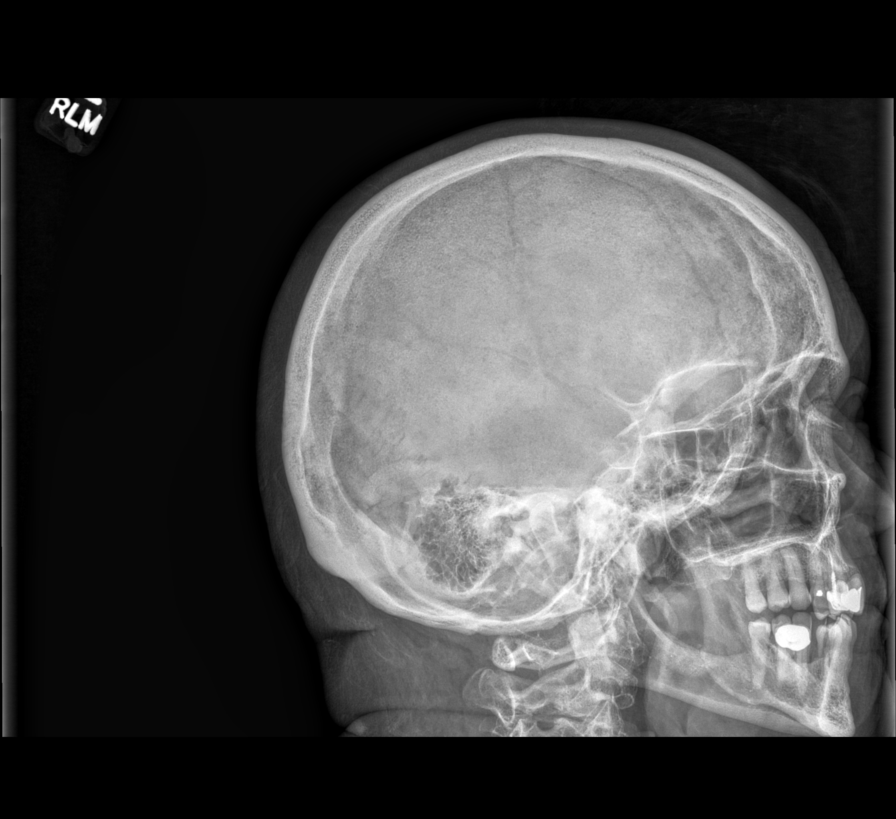

[AP (1 of 7)]
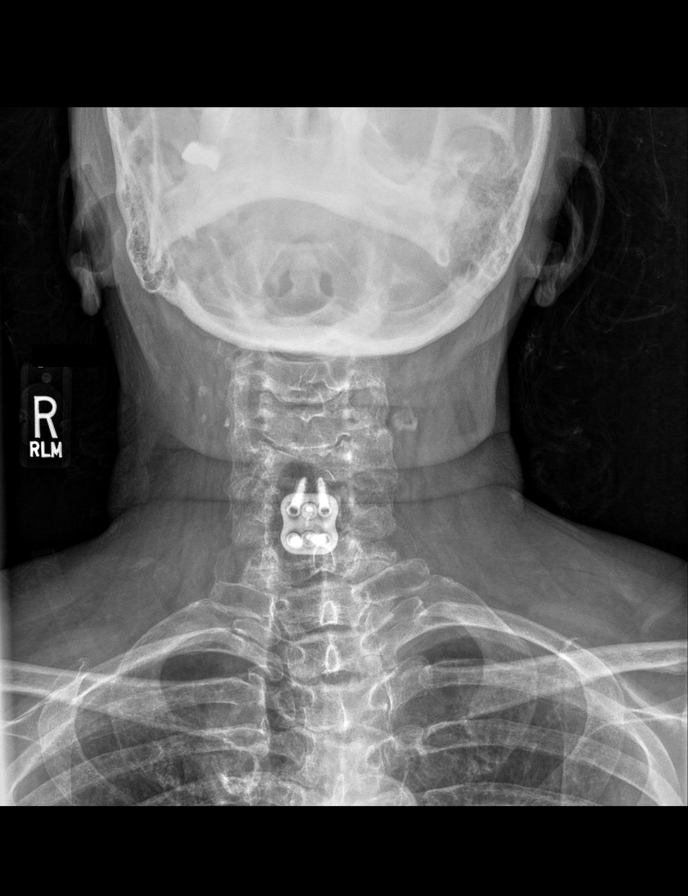

[AP (2 of 7)]
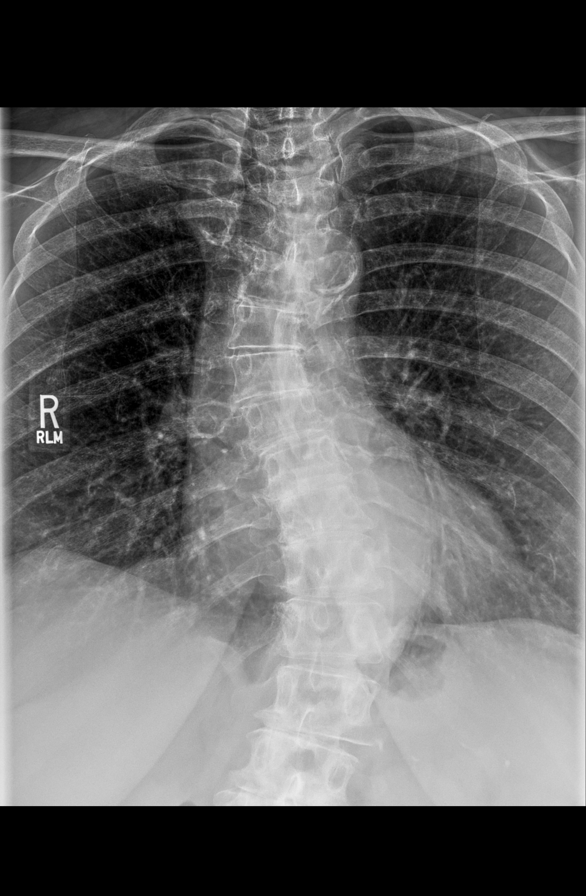

[AP (3 of 7)]
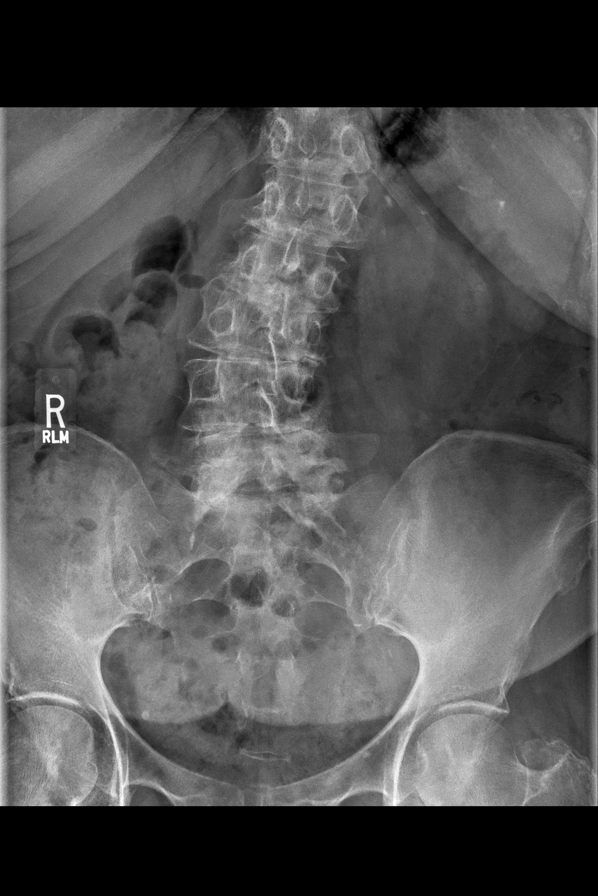

[AP (4 of 7)]
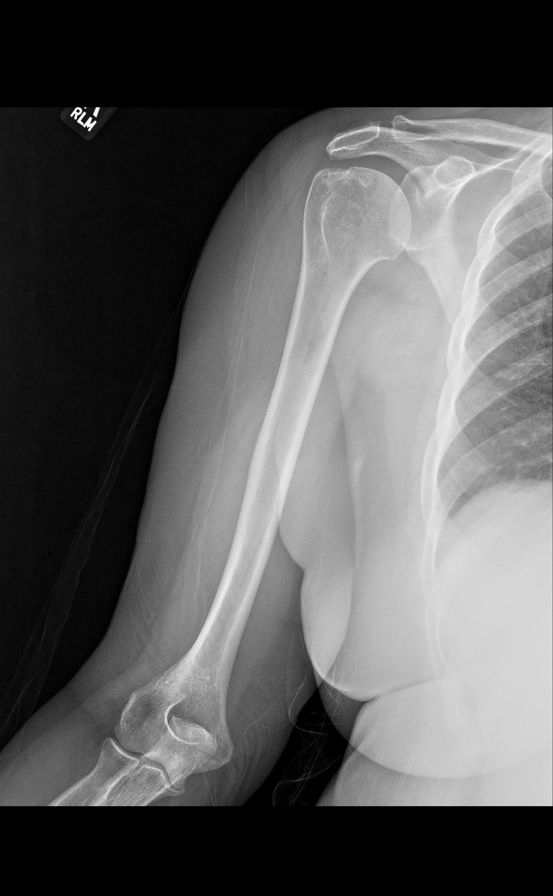

[AP (5 of 7)]
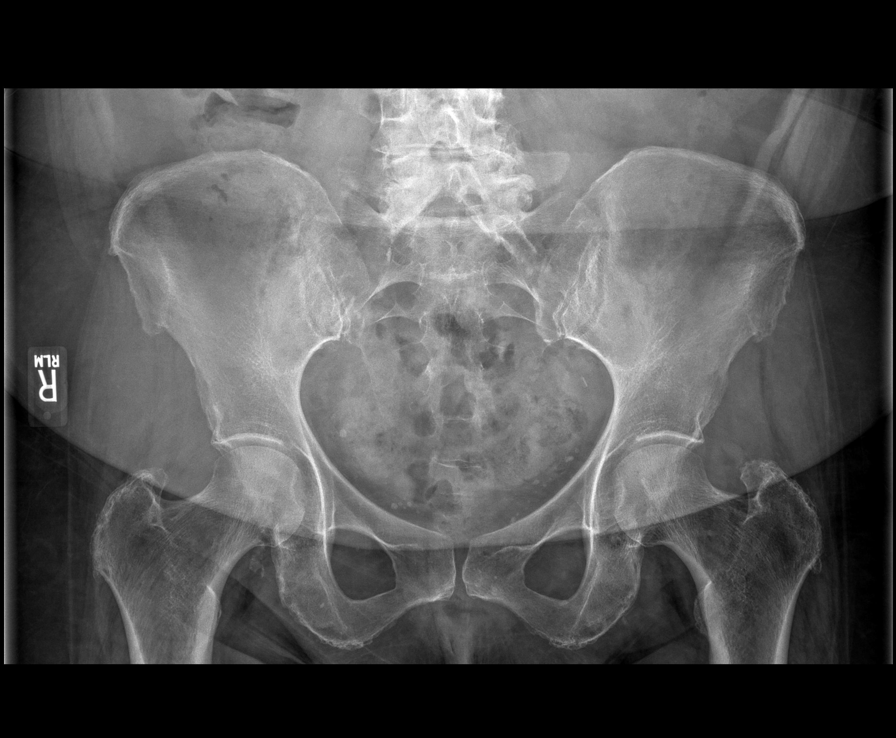

[Series 7: AP · 0.14mm/px · 2 of 4 slices shown (6 of 7)]
[im 1/4]
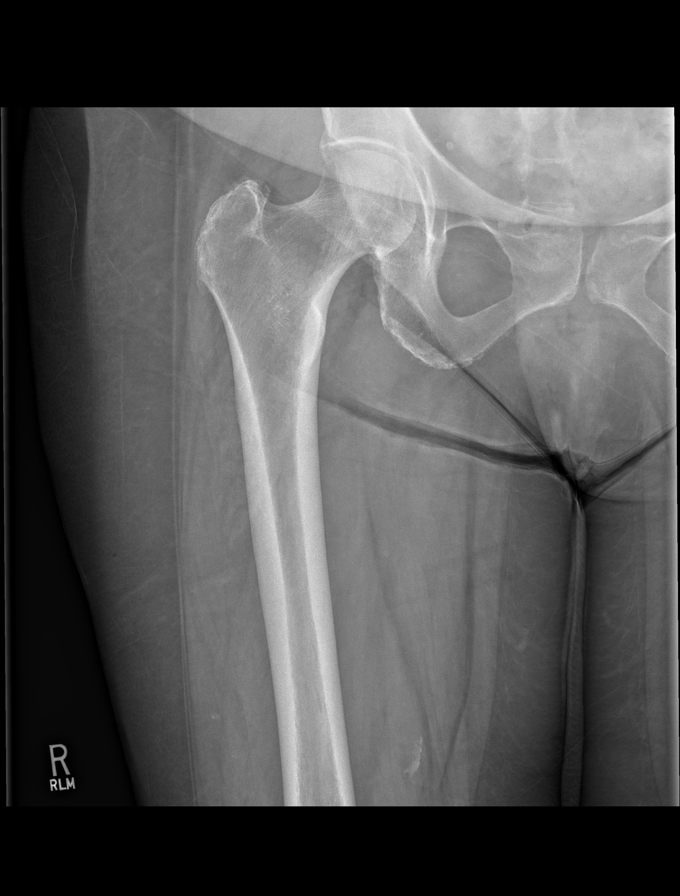
[im 4/4]
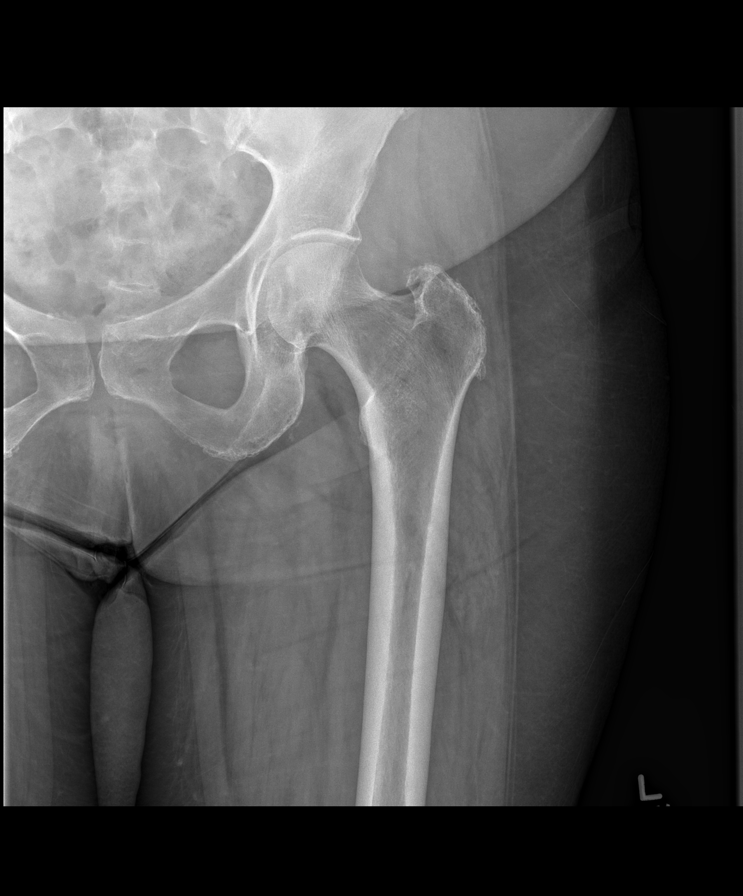

[AP (7 of 7)]
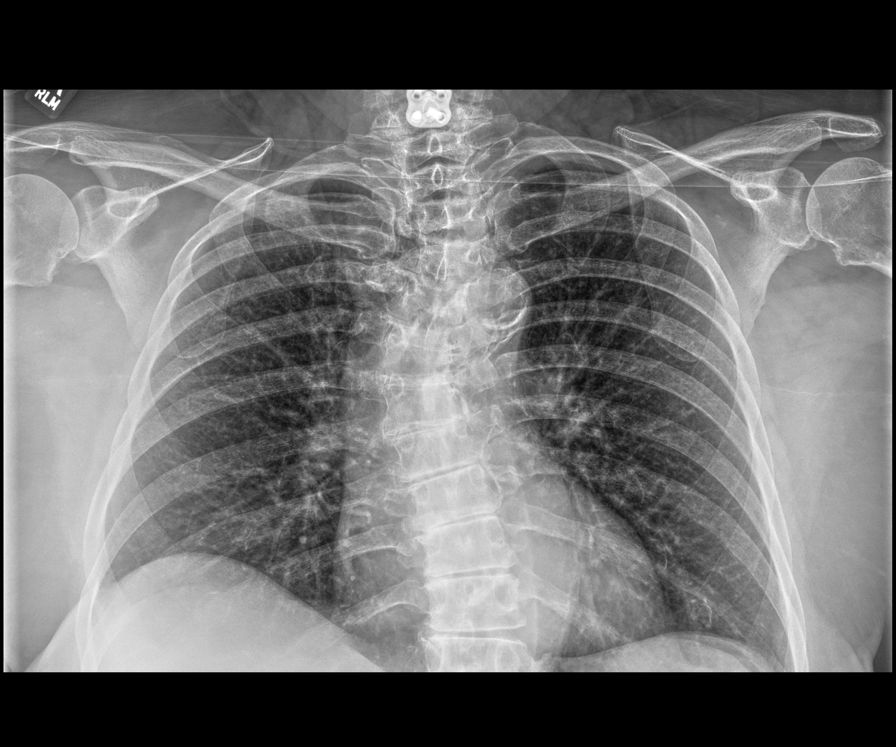

[9 of 9 positions shown; findings below may reference images not displayed]

FINDINGS: No lytic or blastic lesions. No pathologic fractures. Multifocal 
degenerative change. S-shaped thoracolumbar scoliosis. C5-6 ACDF with solid 
fusion. Dental hardware and several absent teeth. Osteopenia. Atherosclerosis.
IMPRESSION: 1.  No lytic or blastic lesions. 
2.  Osteopenia: DXA may be helpful for further evaluation.

## 2021-08-23 IMAGING — MG MAMMOGRAPHY SCREENING BILATERAL 3[PERSON_NAME]
8 series · 8 of 24 positions shown · non-contrast
Comparison: Comparison was made to prior examinations.

________________________________________________________________________________________________ 
MAMMOGRAPHY SCREENING BILATERAL 3KAKI JIM, 08/23/2021 [DATE]: 
CLINICAL INDICATION: Screening mammogram.
TECHNIQUE: Digital bilateral mammograms and 3-D Tomosynthesis were obtained. 
These were interpreted both primarily and with the aid of computer-aided 
detection system.  
BREAST DENSITY: (Level A) The breasts are almost entirely fatty.

[R MLO]
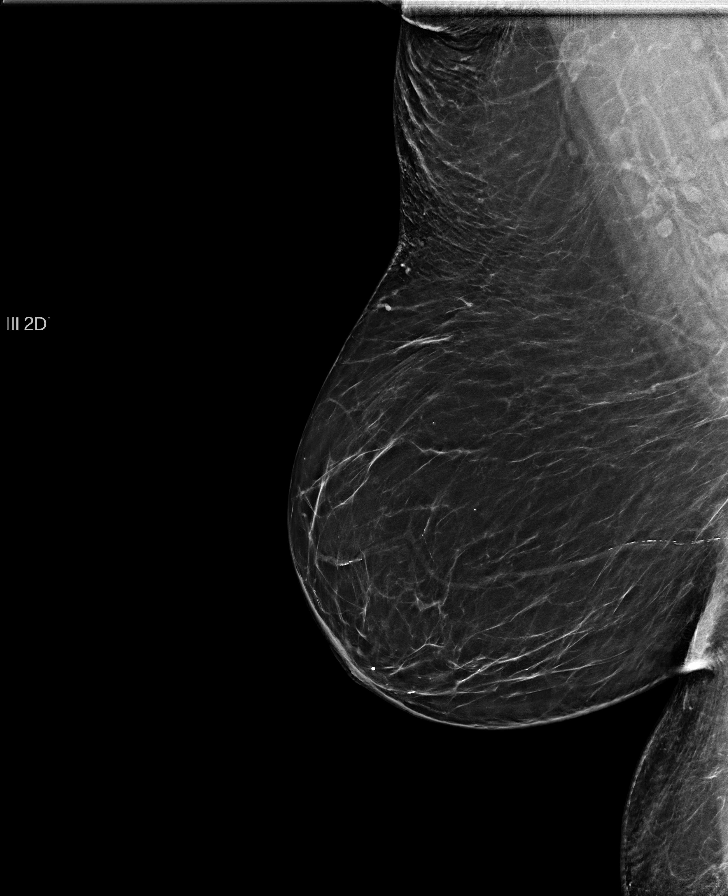

[L MLO]
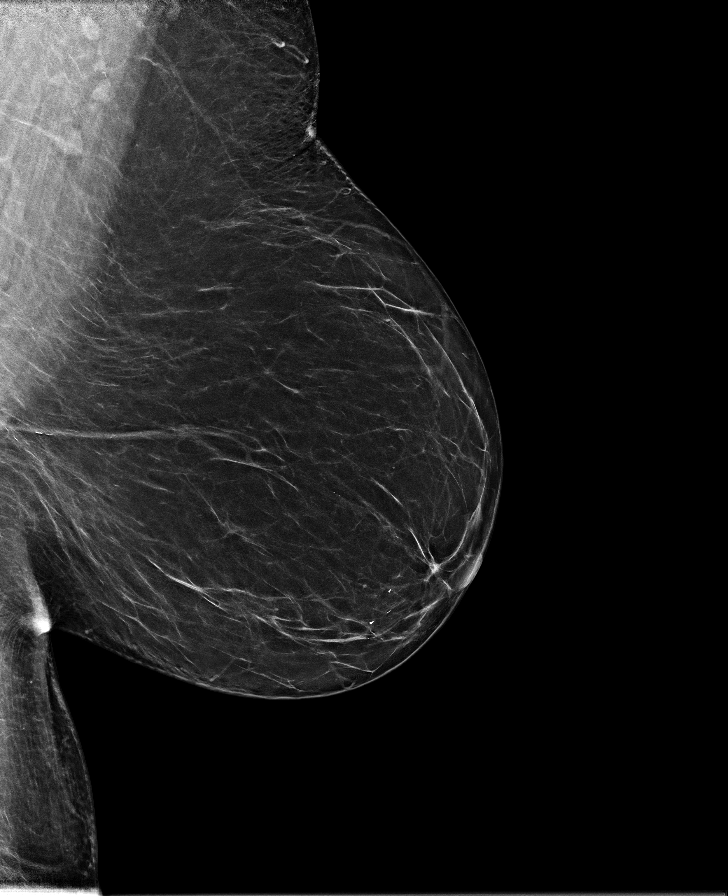

[R CC]
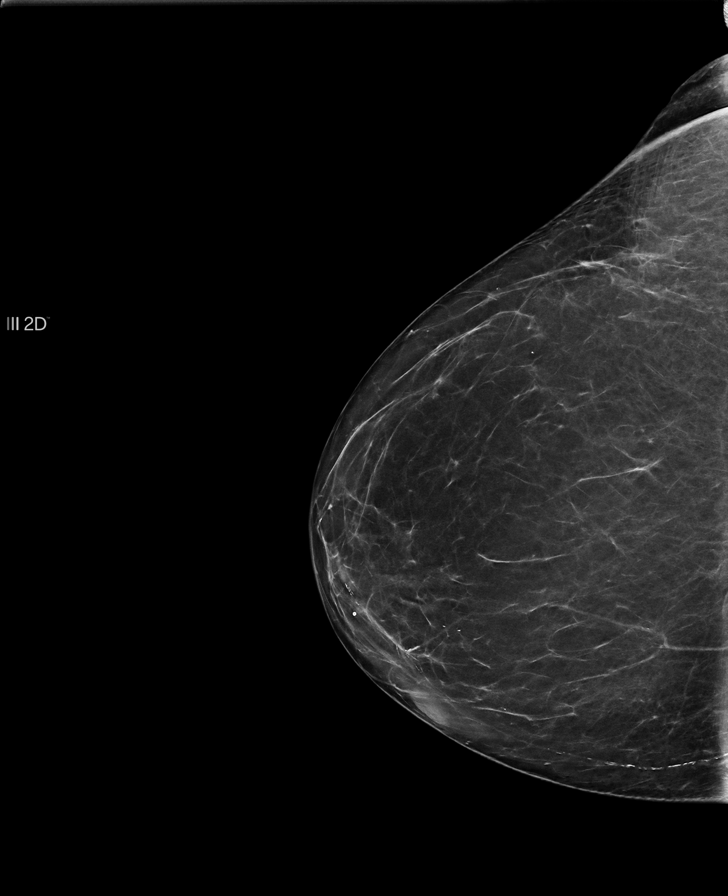

[L CC]
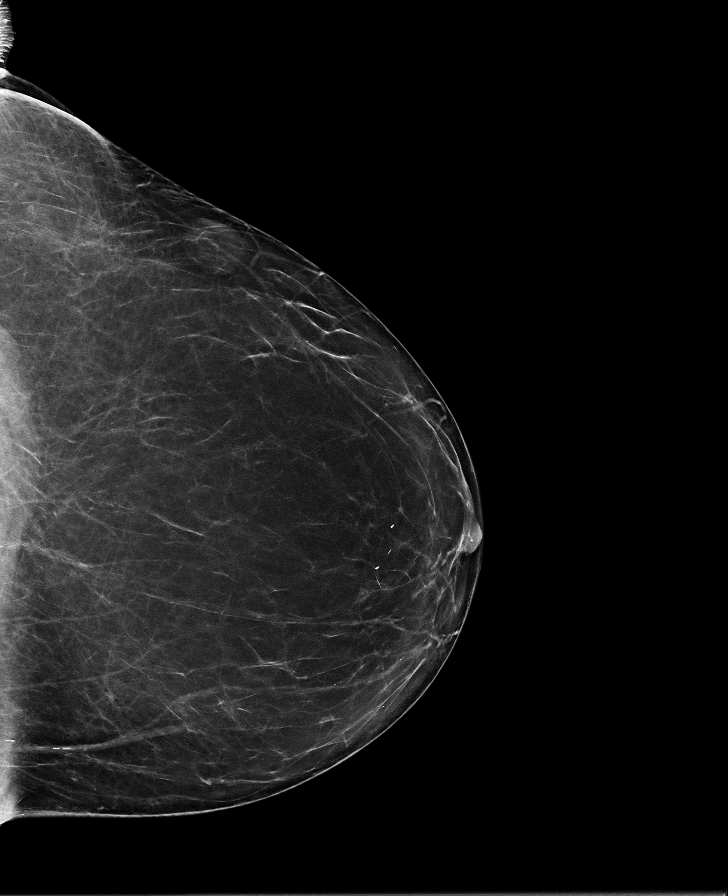

[L CC tomo · tomo slice 35/69.0]
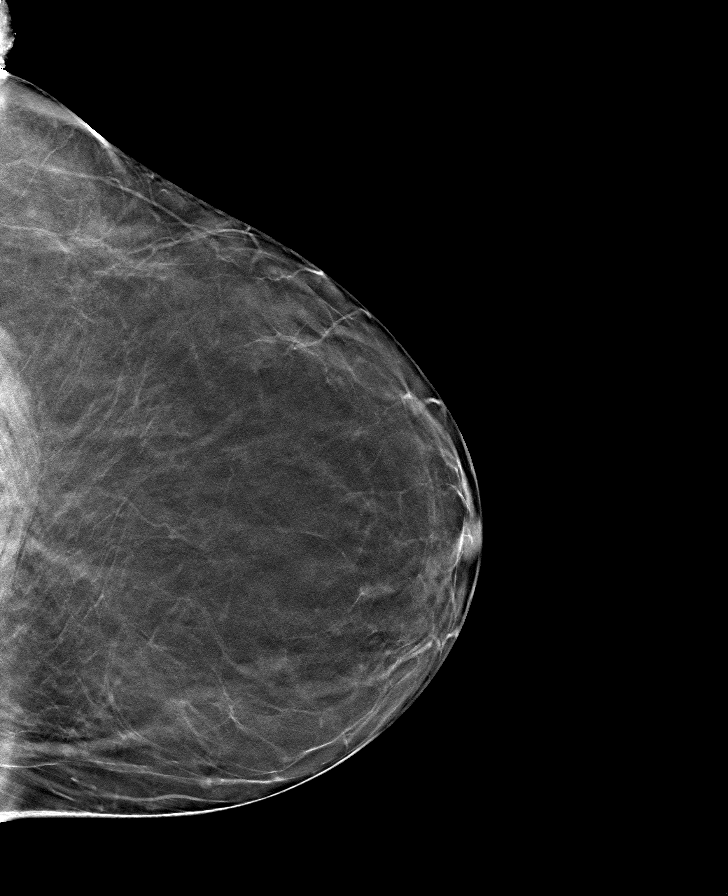

[R CC tomo · tomo slice 31/62.0]
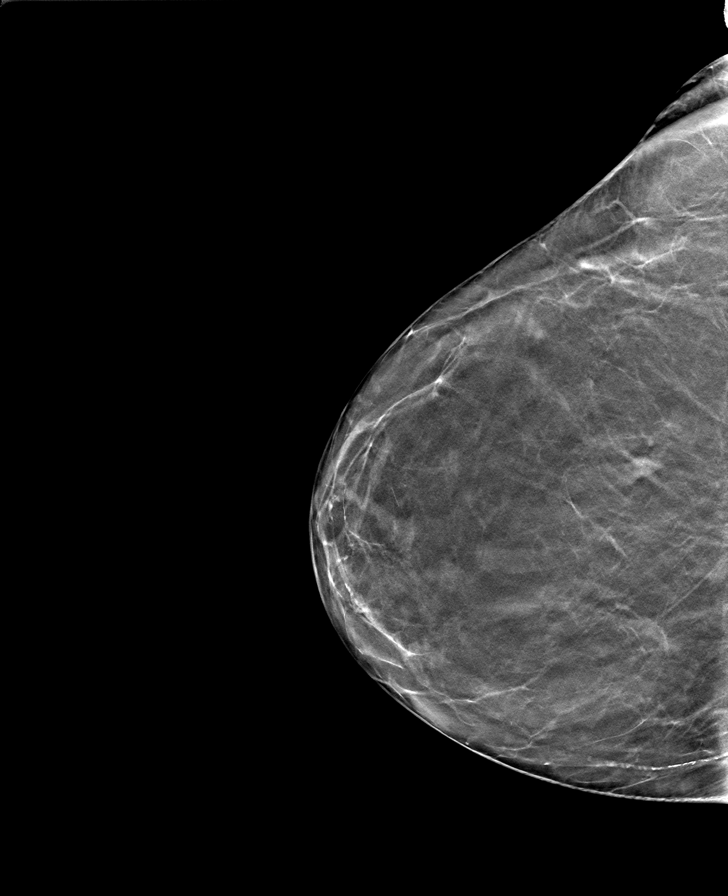

[L MLO tomo · tomo slice 39/76.0]
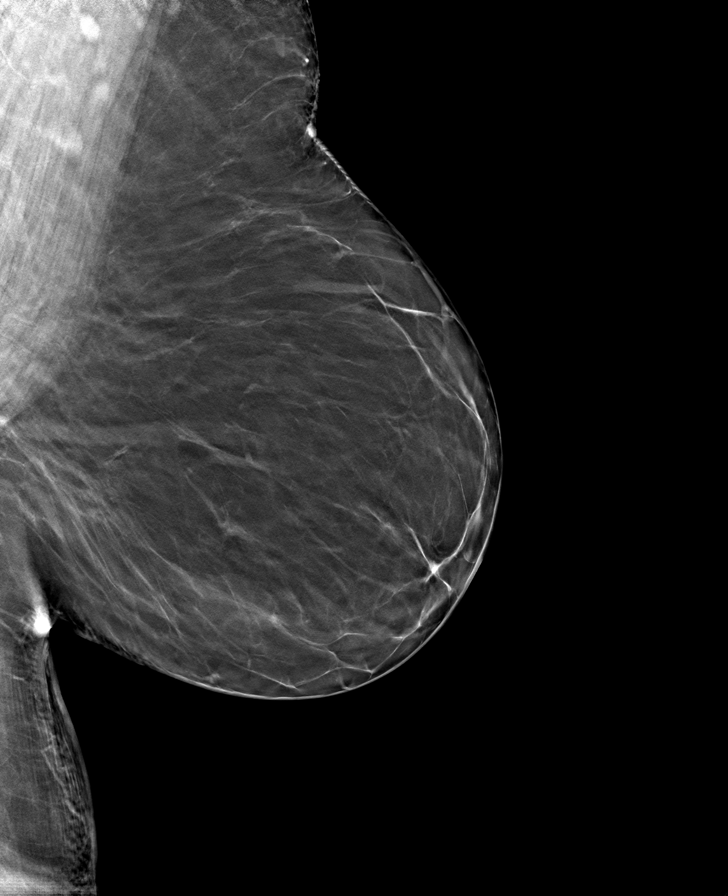

[R MLO tomo · tomo slice 40/79.0]
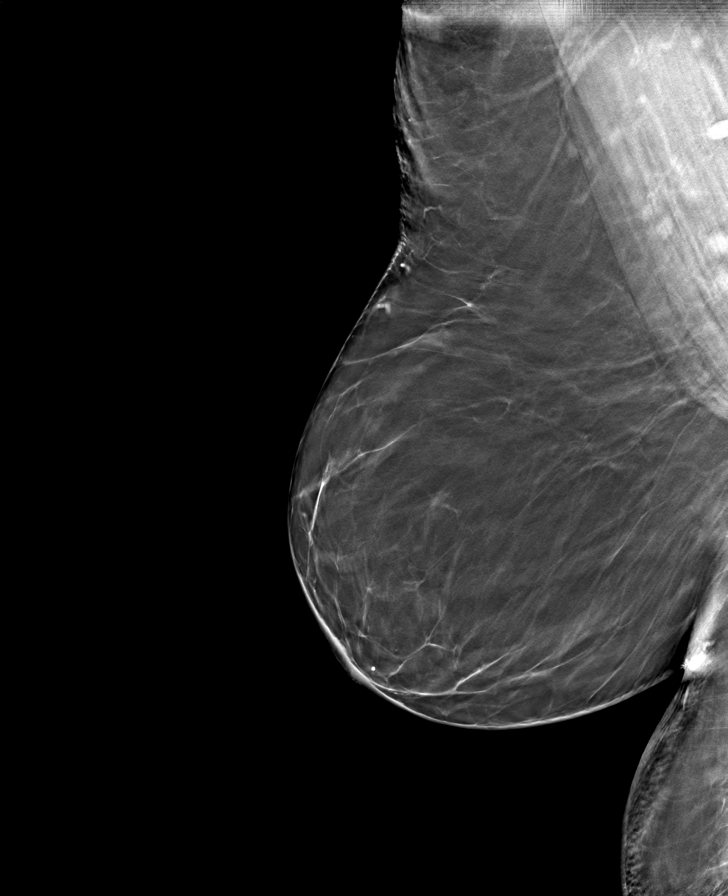

[8 of 24 positions shown; findings below may reference images not displayed]

FINDINGS: No mammographically suspicious abnormality and no significant change from prior 
mammograms.
IMPRESSION: (BI-RADS 1) Negative mammogram. Routine mammographic follow-up is recommended.

## 2021-11-11 IMAGING — CT CT CHEST WITHOUT CONTRAST
2 of 5 series · 14 of 36 positions shown, 17 images · non-contrast
Comparison: 06/01/2020.

________________________________________________________________________________________________ 
CT CHEST WITHOUT CONTRAST, 11/11/2021 [DATE]: 
CLINICAL INDICATION: Pulmonary nodule. 
A search for DICOM formatted images was conducted for prior CT imaging studies 
completed at a non-affiliated media free facility.
TECHNIQUE: The chest was scanned from base of neck through the lung bases 
without contrast on a high resolution low dose CT scanner. Routine MPR and MIP 
3D renderings were performed with concurrent physician supervision.

[Series 2: chest w/o 2.0 i31s 3 · axial · non-contrast · 0.83mm/px · z∈[-279,-7]mm · 11 of 164 slices shown, 14 images]
[im 14/164  mediastinal]
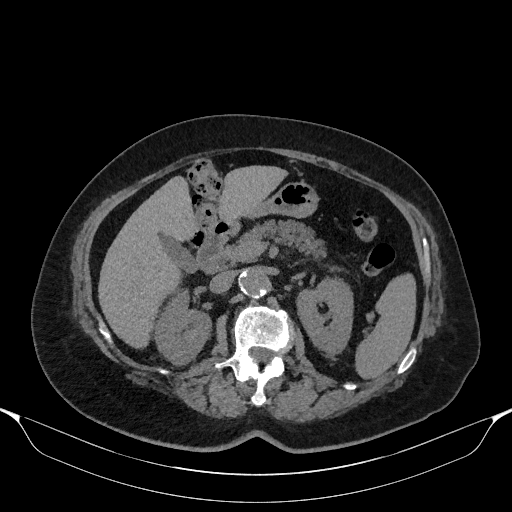
[im 14/164  lung]
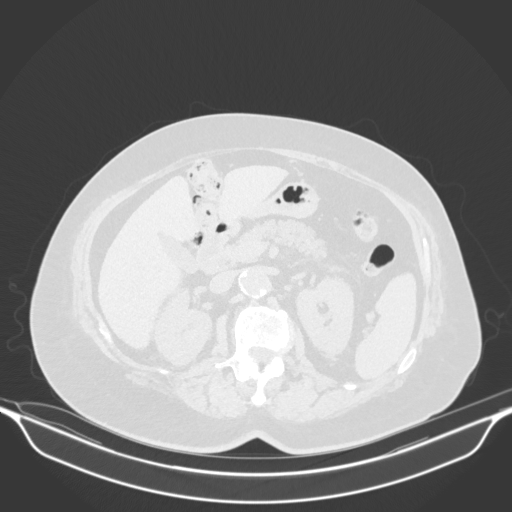
[im 28/164  lung]
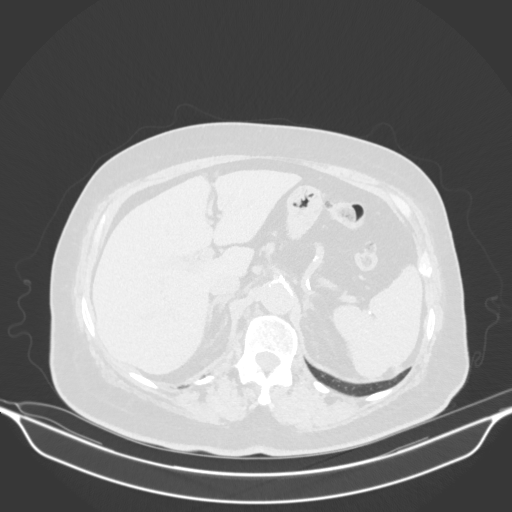
[im 41/164  lung]
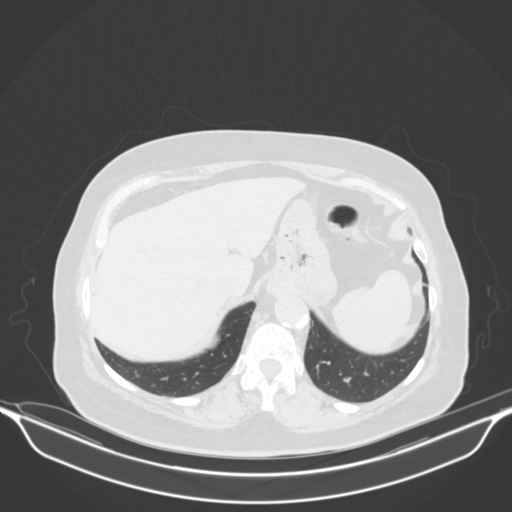
[im 55/164  lung]
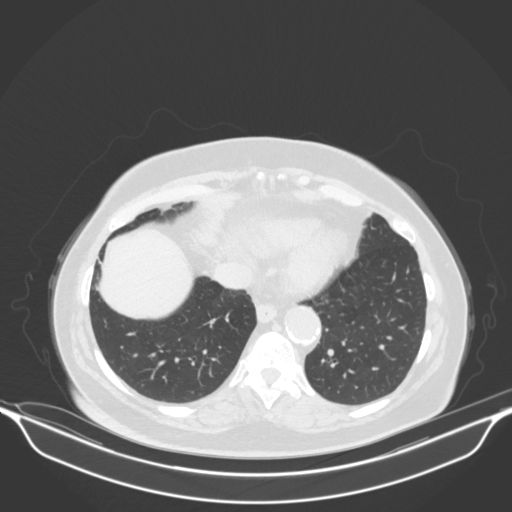
[im 68/164  mediastinal]
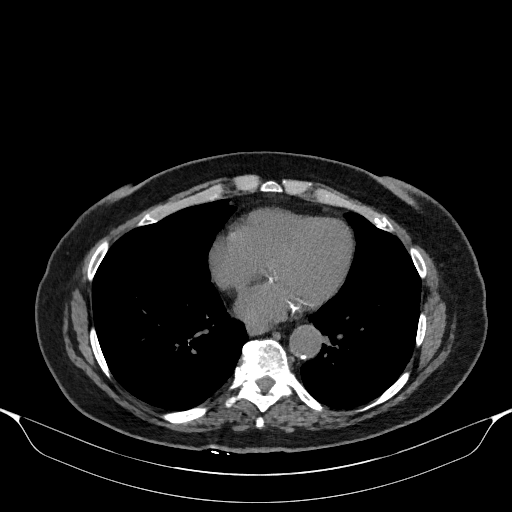
[im 68/164  lung]
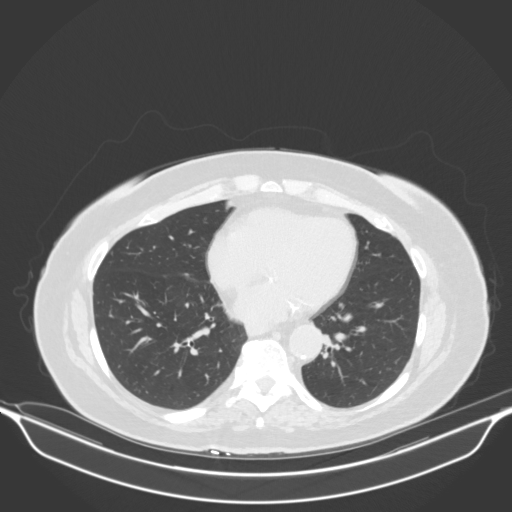
[im 82/164  lung]
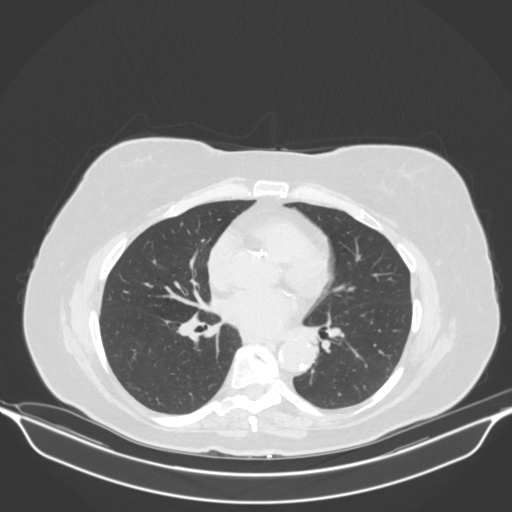
[im 96/164  lung]
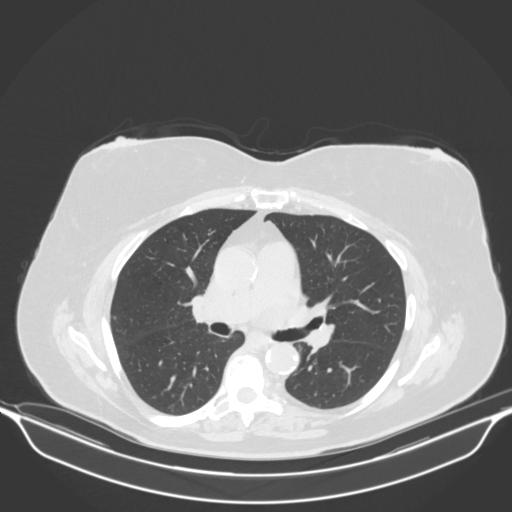
[im 109/164  lung]
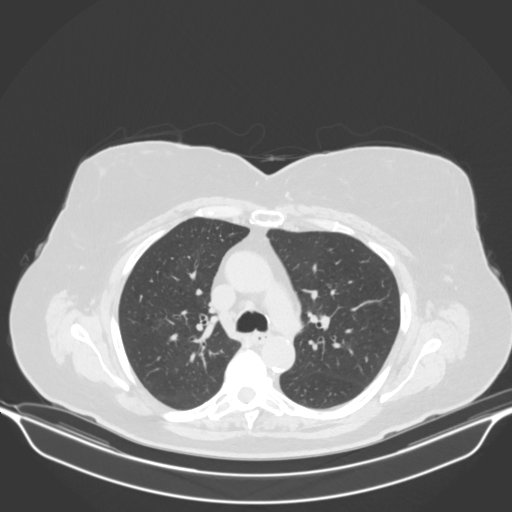
[im 123/164  mediastinal]
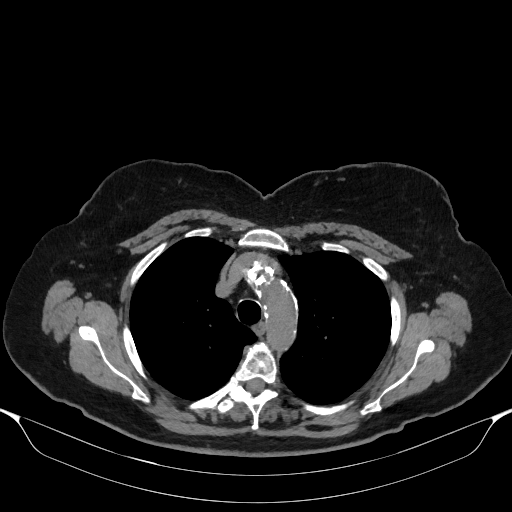
[im 123/164  lung]
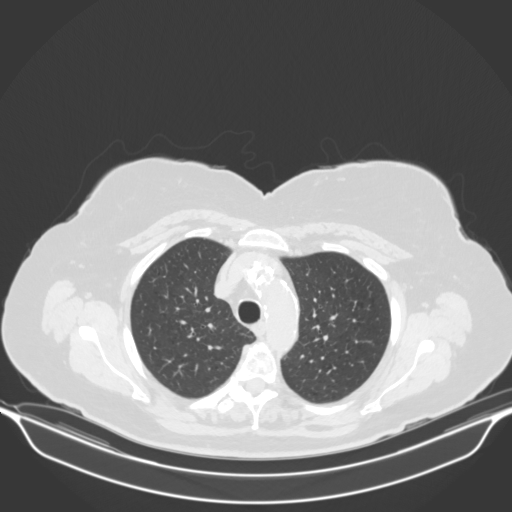
[im 136/164  lung]
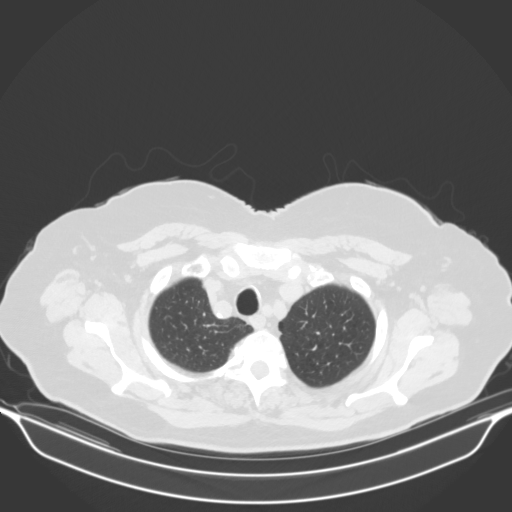
[im 150/164  lung]
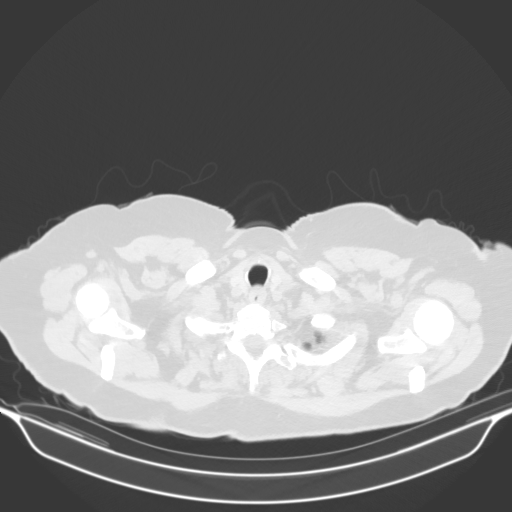

[Series 4: coronal · coronal · 0.66mm/px · 3 of 150 slices shown]
[im 30/150  lung]
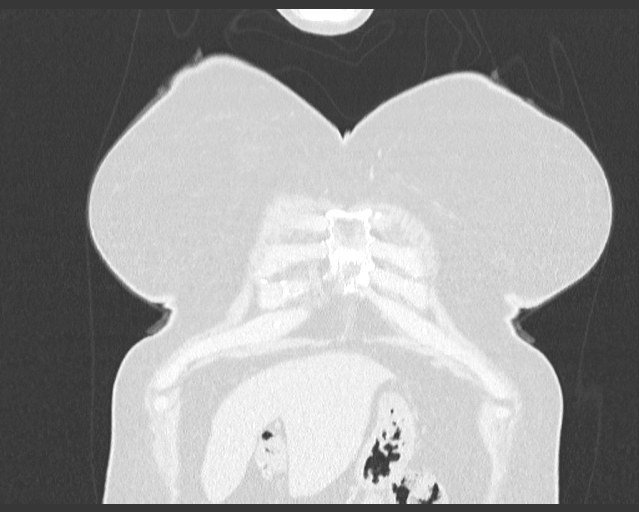
[im 60/150  lung]
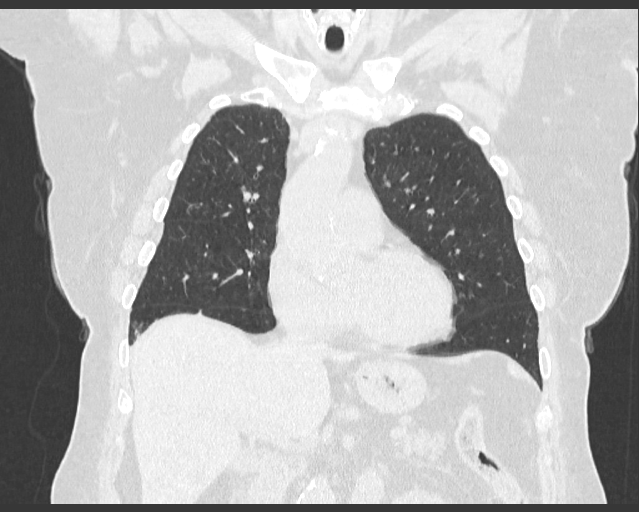
[im 90/150  lung]
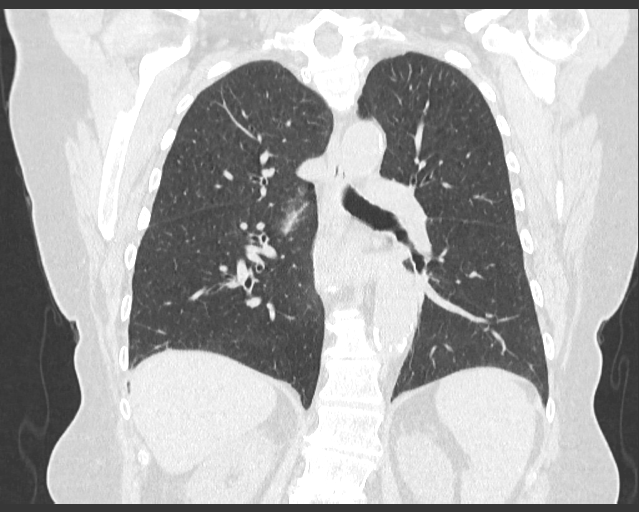

[14 of 36 positions shown; findings below may reference images not displayed]

FINDINGS: LUNGS AND PLEURA:  Stable scattered noncalcified pulmonary nodules the largest 
in the left lower lobe measures 4 mm. No new suspicious pulmonary nodules. Mild 
emphysematous change. Mild scattered fibrosis. No acute consolidation. No 
pleural effusion.  
MEDIASTINUM:  No adenopathy. Normal heart size. No pericardial effusion. No 
coronary artery calcifications noted. 
CHEST WALL/AXILLA: No mass or adenopathy.  
UPPER ABDOMEN: Small hiatal hernia. 
MUSCULOSKELETAL: No acute abnormality. Cervical ACDF.
IMPRESSION: Stable scattered noncalcified micronodules largest in the left lower lobe 
measures 4 mm. No new suspicious pulmonary nodules. Please refer to the 
[HOSPITAL] recommendations for follow-up below.  
Incidental, multiple solid nodules < 6 mm: 
Low Risk: No Routine follow-up.  
High risk: Optional CT at 12 months.  
NOTE: These recommendations do not apply to patients with immunosuppression or 
patients with known primary cancer.  
REFERENCE: [HOSPITAL] 2070 Guidelines for Management of Incidentally 
Detected Pulmonary Nodules in Adults. Radiology 2070.  
RADIATION DOSE REDUCTION: All CT scans are performed using radiation dose 
reduction techniques, when applicable.  Technical factors are evaluated and 
adjusted to ensure appropriate moderation of exposure.  Automated dose 
management technology is applied to adjust the radiation doses to minimize 
exposure while achieving diagnostic quality images.

## 2022-02-17 IMAGING — DX CHEST PA AND LATERAL
1 series · 2 of 2 positions shown · non-contrast
Comparison: 05/12/2021

________________________________________________________________________________________________ 
CLINICAL INDICATION: Mild intermittent asthma, uncomplicated.

[Series 1: PA · 0.14mm/px · 2 of 2 slices shown]
[im 1/2]
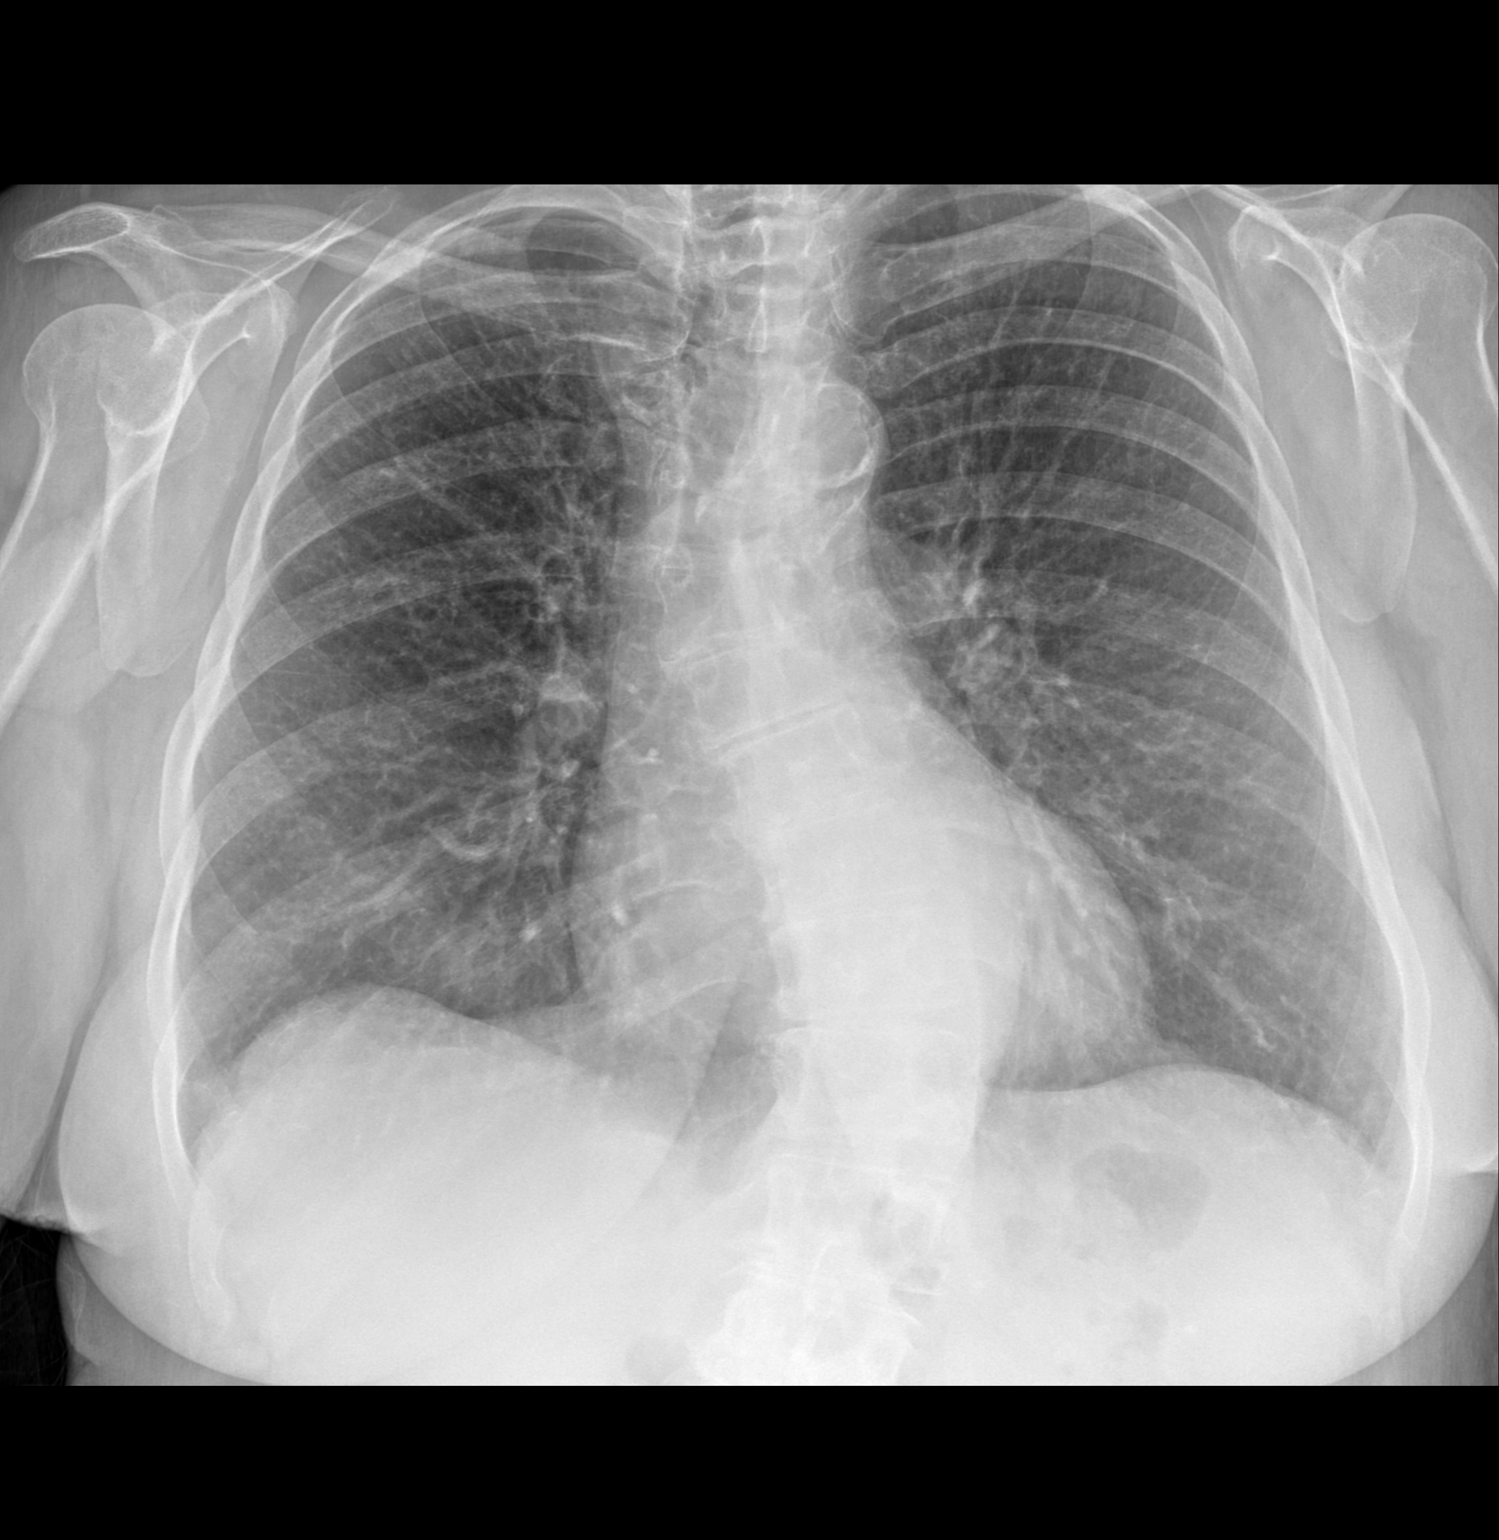
[im 2/2]
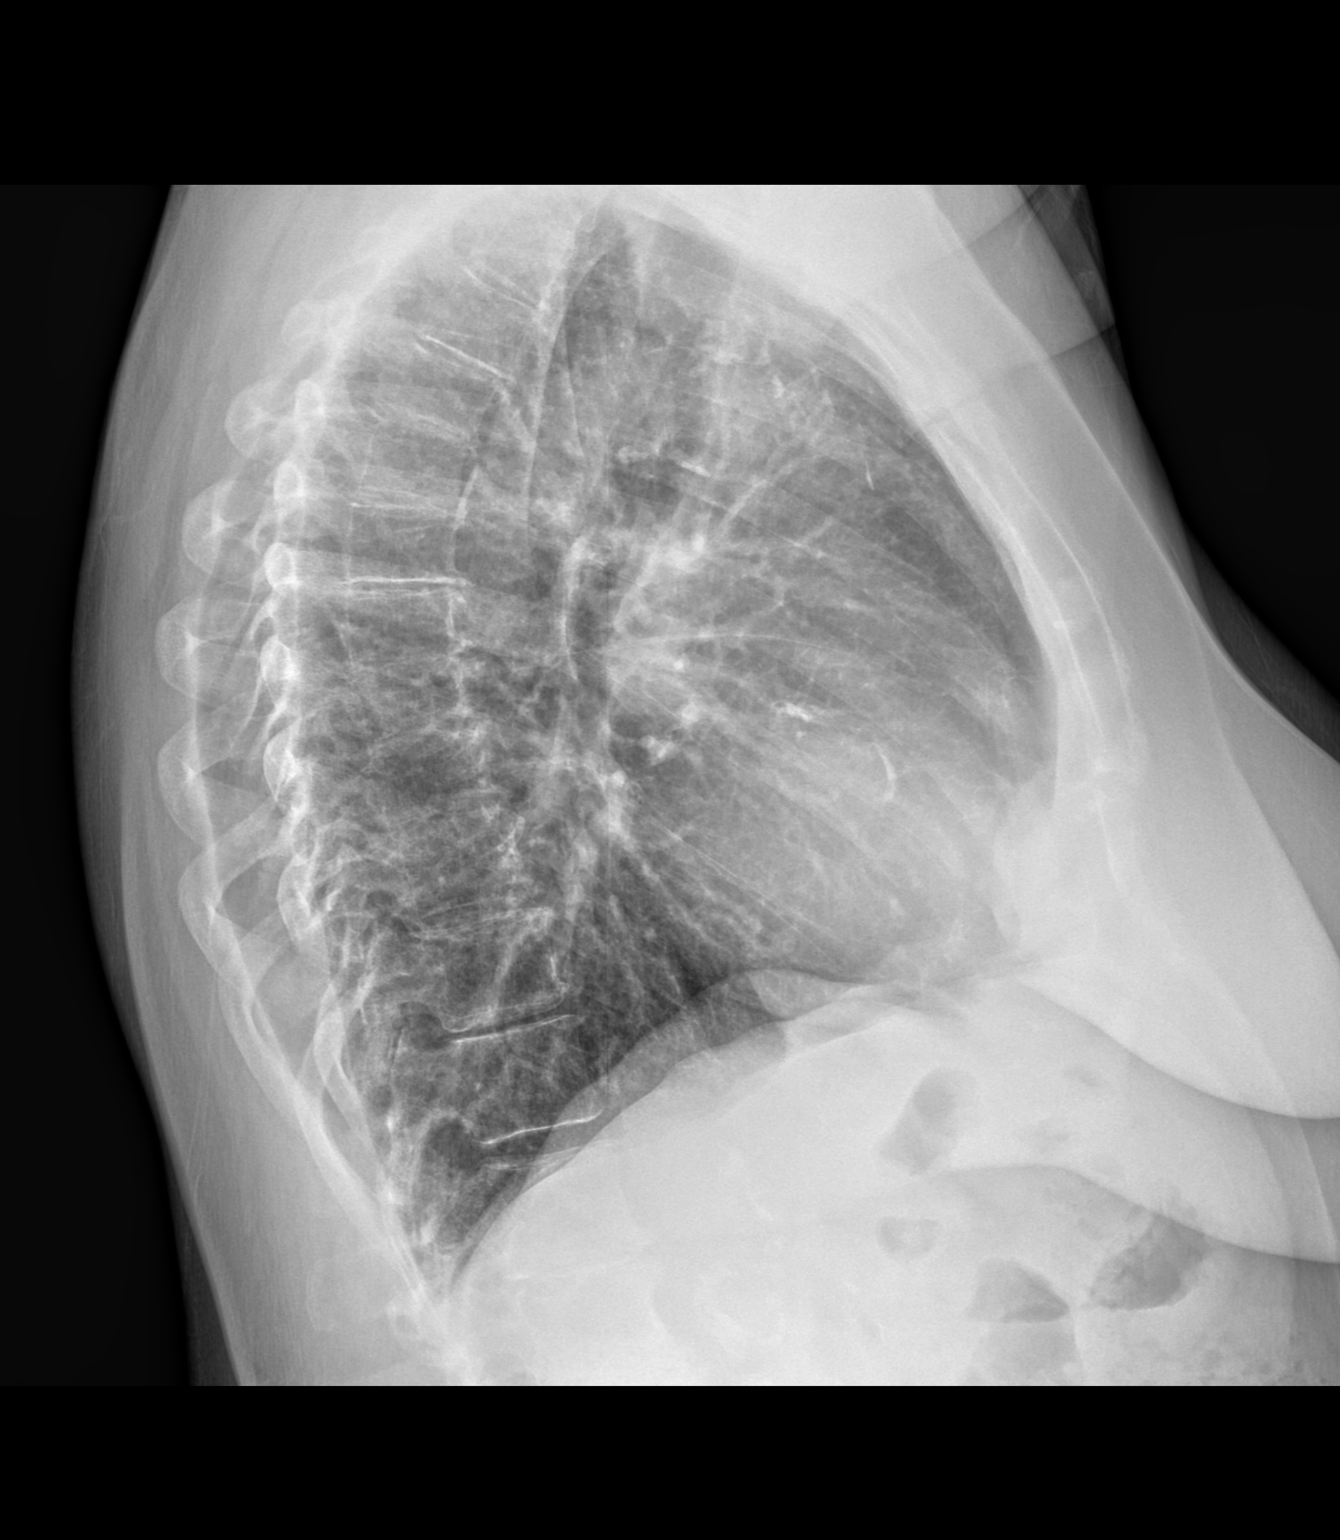

[2 of 2 positions shown; findings below may reference images not displayed]

FINDINGS: Lungs are clear. No consolidation. No effusion. Normal cardiac size 
and pulmonary vascularity. No pneumothorax. No acute osseous abnormality. Lower 
cervical spine fusion. Thoracolumbar curvature. Stable exam.
IMPRESSION: No acute cardiopulmonary findings.

## 2022-02-17 IMAGING — DX SKELETAL SURVEY, COMPLETE
7 of 8 series · 9 of 10 positions shown · non-contrast
Comparison: 11/11/2021 CT chest and 02/14/2021 skeletal survey.

________________________________________________________________________________________________ 
SKELETAL SURVEY, COMPLETE, 02/17/2022 [DATE]: 
CLINICAL INDICATION: MGUS.

[lateral (1 of 2)]
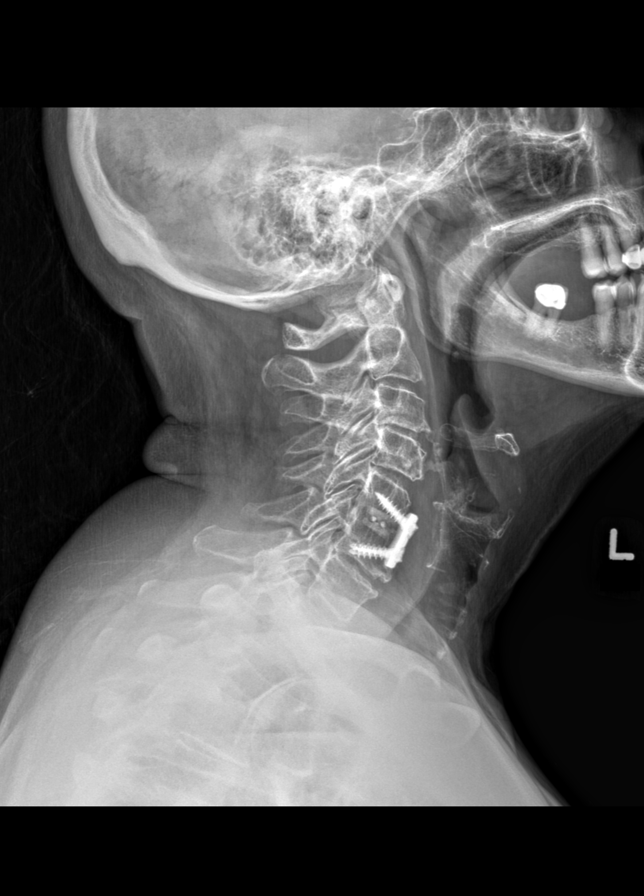

[lateral (2 of 2)]
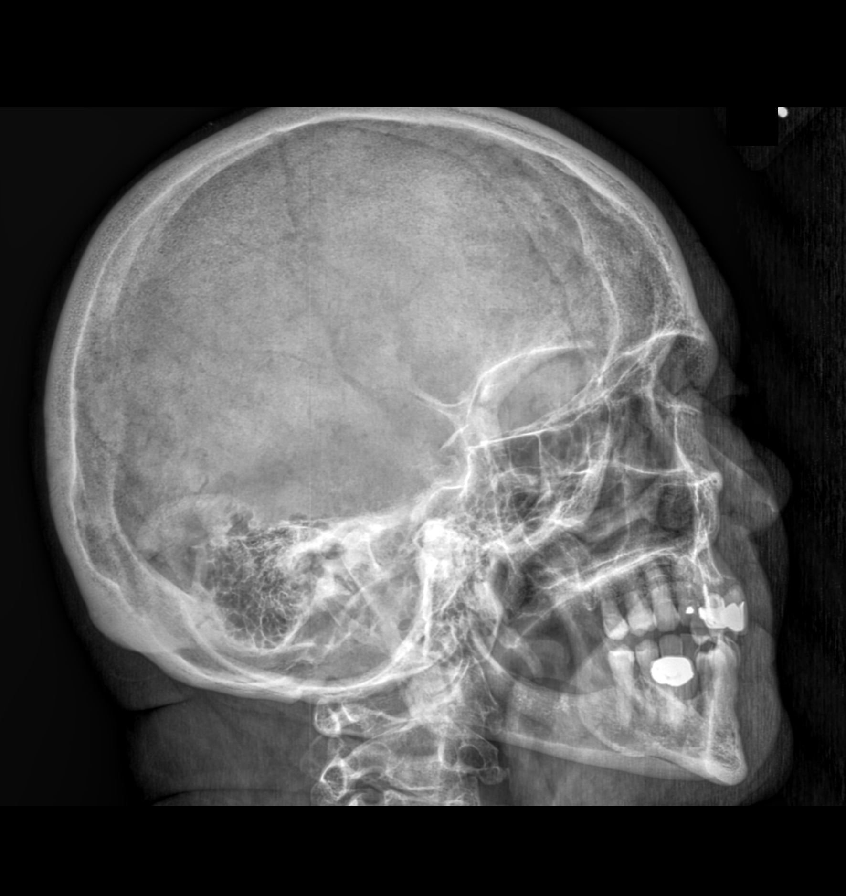

[AP (1 of 5)]
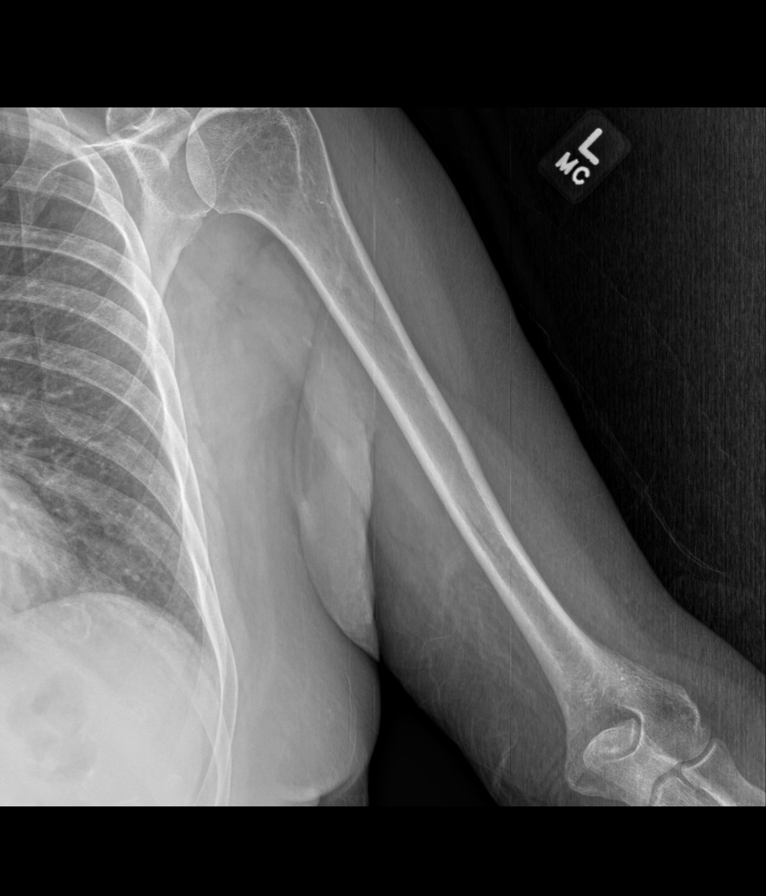

[AP (2 of 5)]
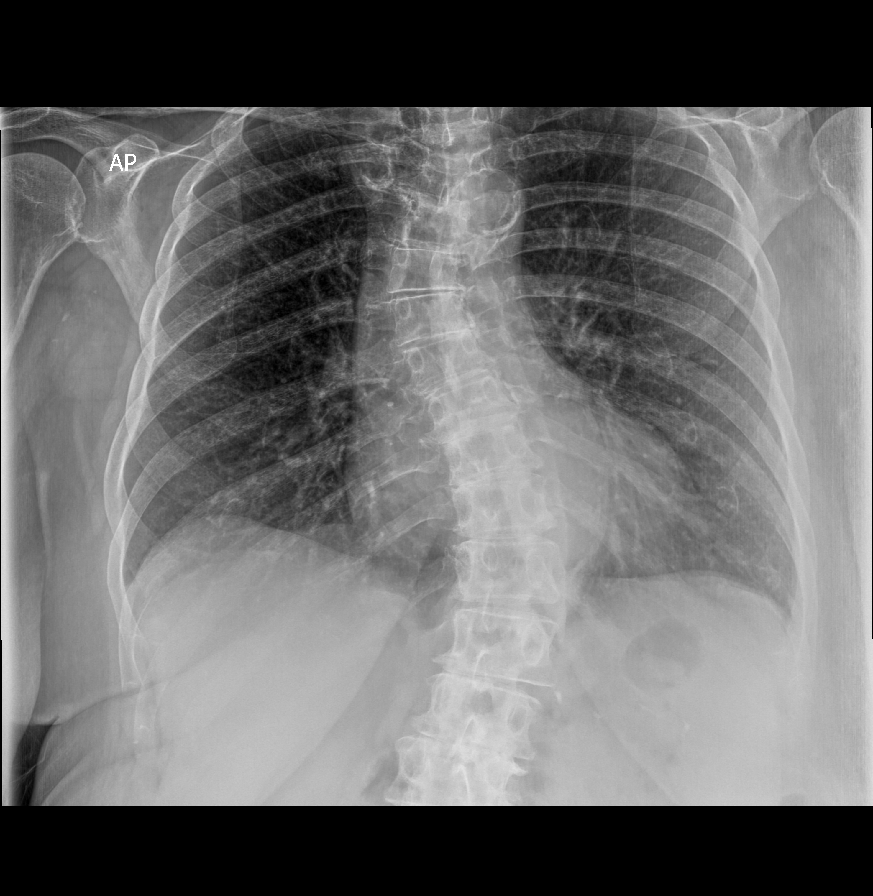

[AP (3 of 5)]
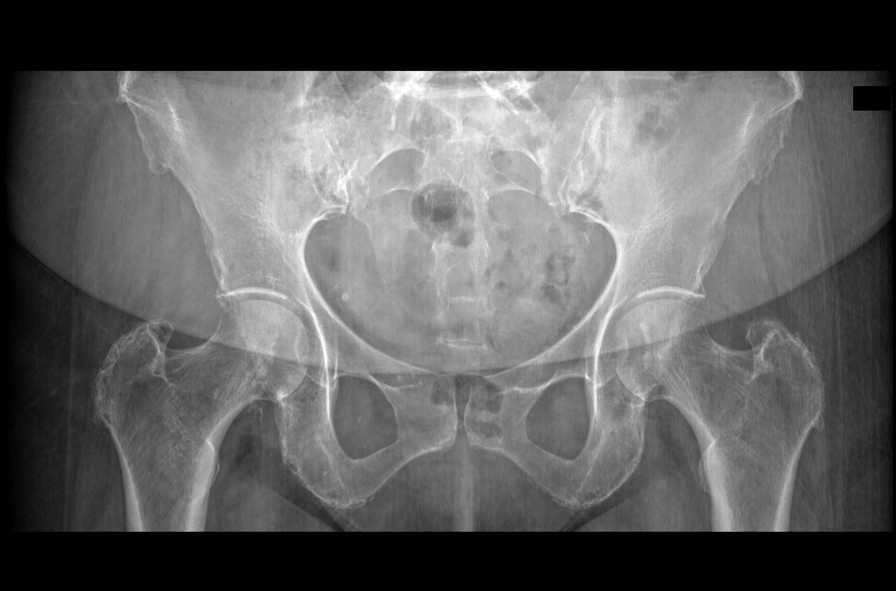

[Series 6: AP · 0.14mm/px · 3 of 4 slices shown (4 of 5)]
[im 1/4]
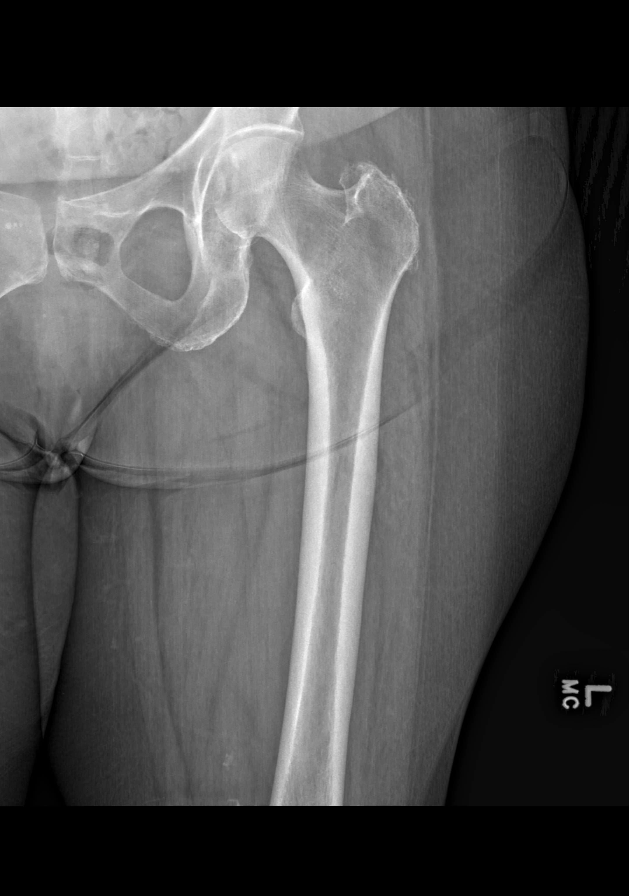
[im 2/4]
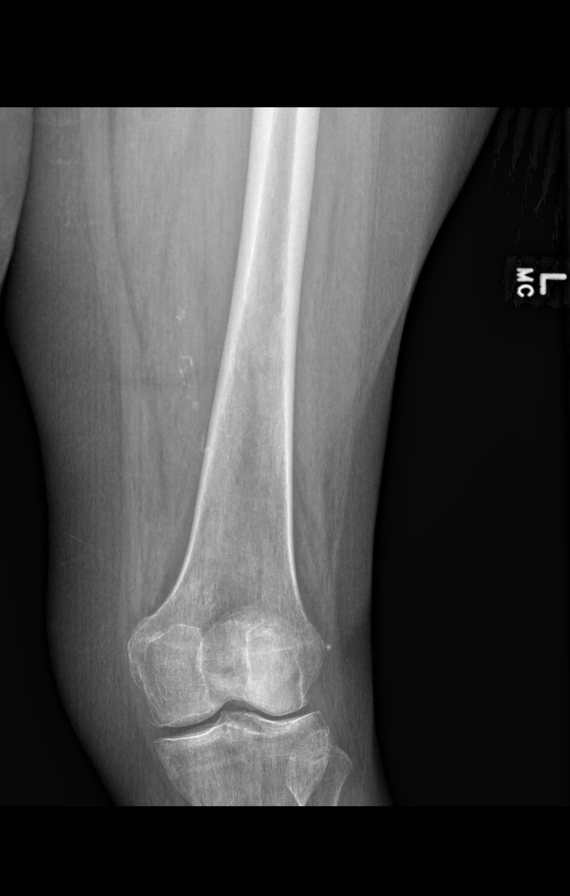
[im 4/4]
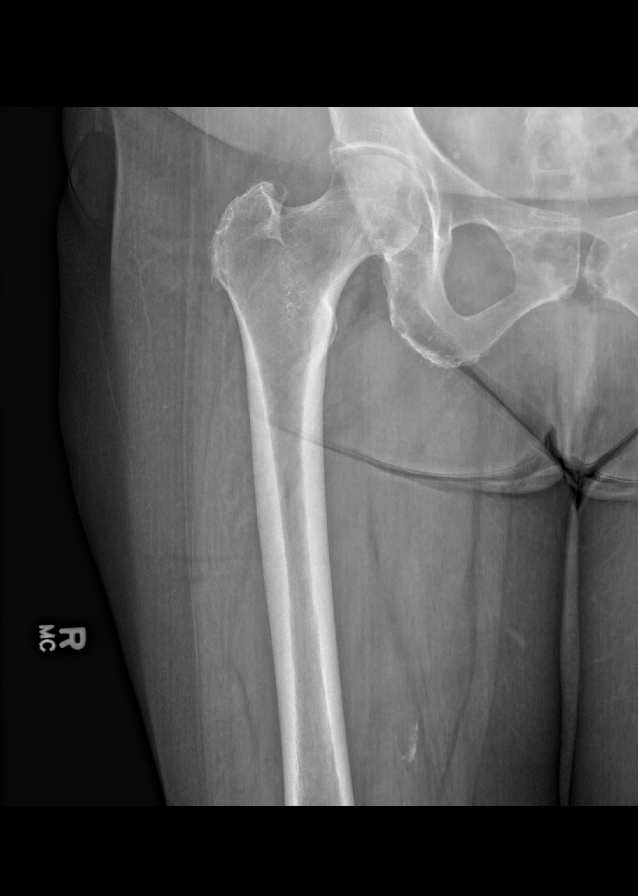

[AP (5 of 5)]
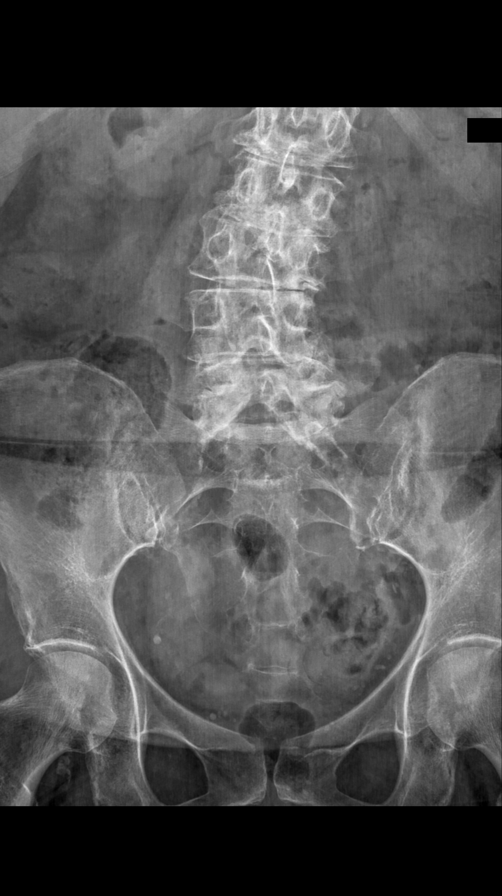

[9 of 10 positions shown; findings below may reference images not displayed]

FINDINGS: No lytic or blastic lesions. No pathologic fractures. Multifocal 
degenerative change.
IMPRESSION: 
FINDINGS: No lytic or blastic lesions. No pathologic fractures. Multifocal 
degenerative change. S-shaped thoracolumbar scoliosis. C5-6 ACDF with solid 
fusion. Dental hardware and several absent teeth. Osteopenia. Atherosclerosis 
and carotid artery calcifications.
IMPRESSION: 1.  No lytic or blastic lesions. 
2. Osteopenia

## 2022-04-29 IMAGING — DX KNEE 3 VIEWS LEFT
1 series · 3 of 3 positions shown · non-contrast
Comparison: None.

________________________________________________________________________________________________ 
KNEE 3 VIEWS RIGHT, KNEE 3 VIEWS LEFT, 04/29/2022 [DATE]: 
CLINICAL INDICATION: Bilateral knee pain.

[Series 1: AP · 0.14mm/px · 3 of 3 slices shown]
[im 1/3]
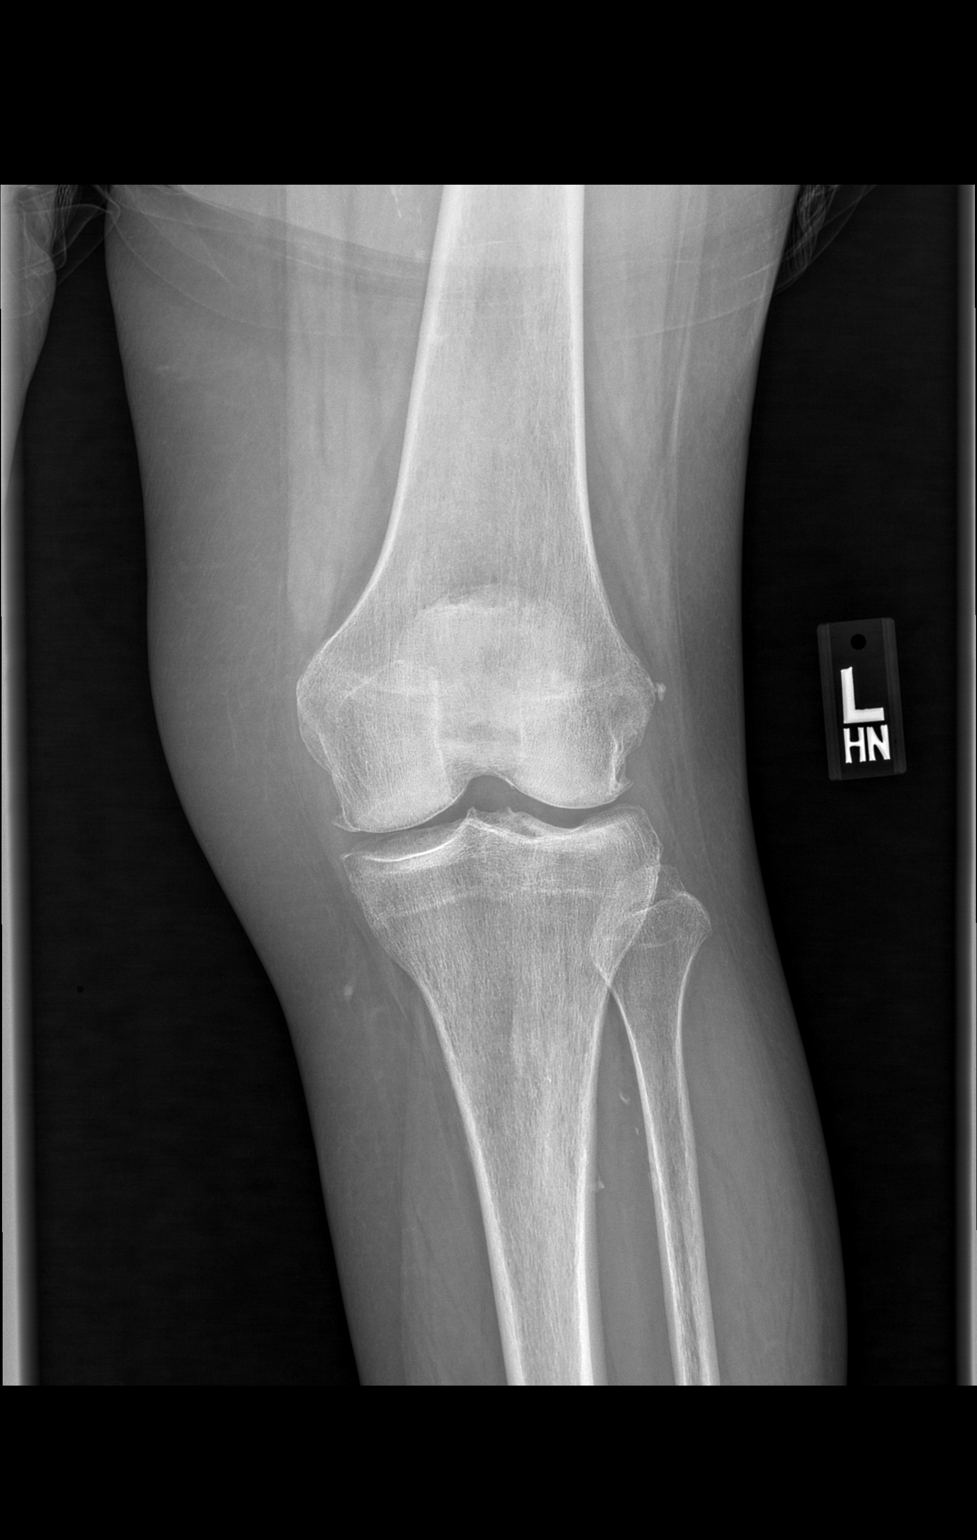
[im 2/3]
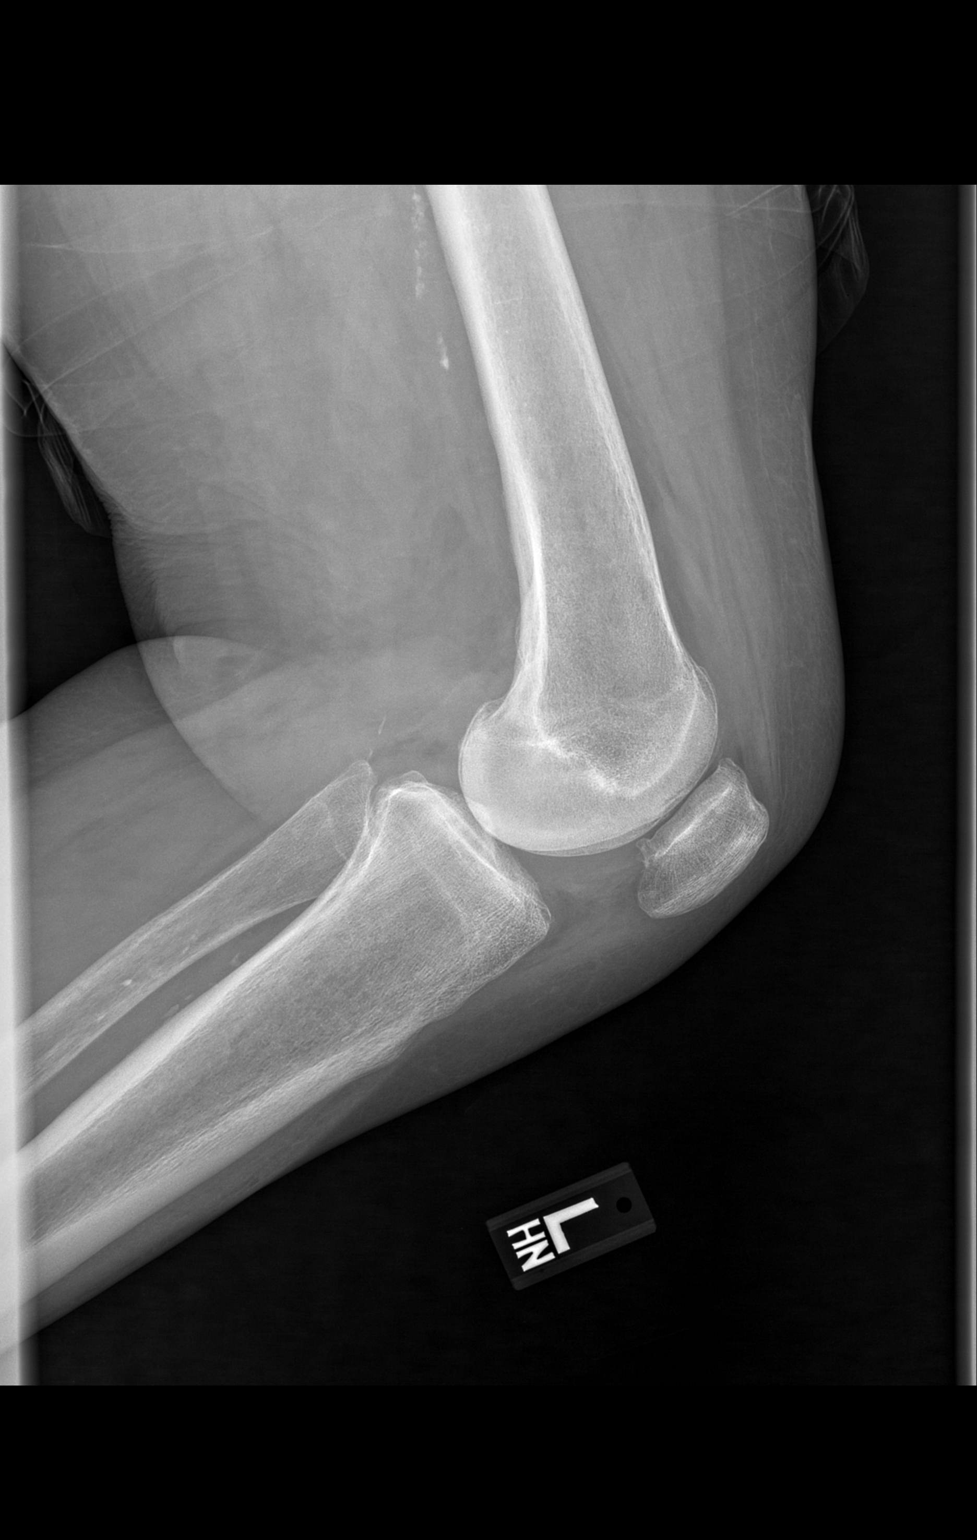
[im 3/3]
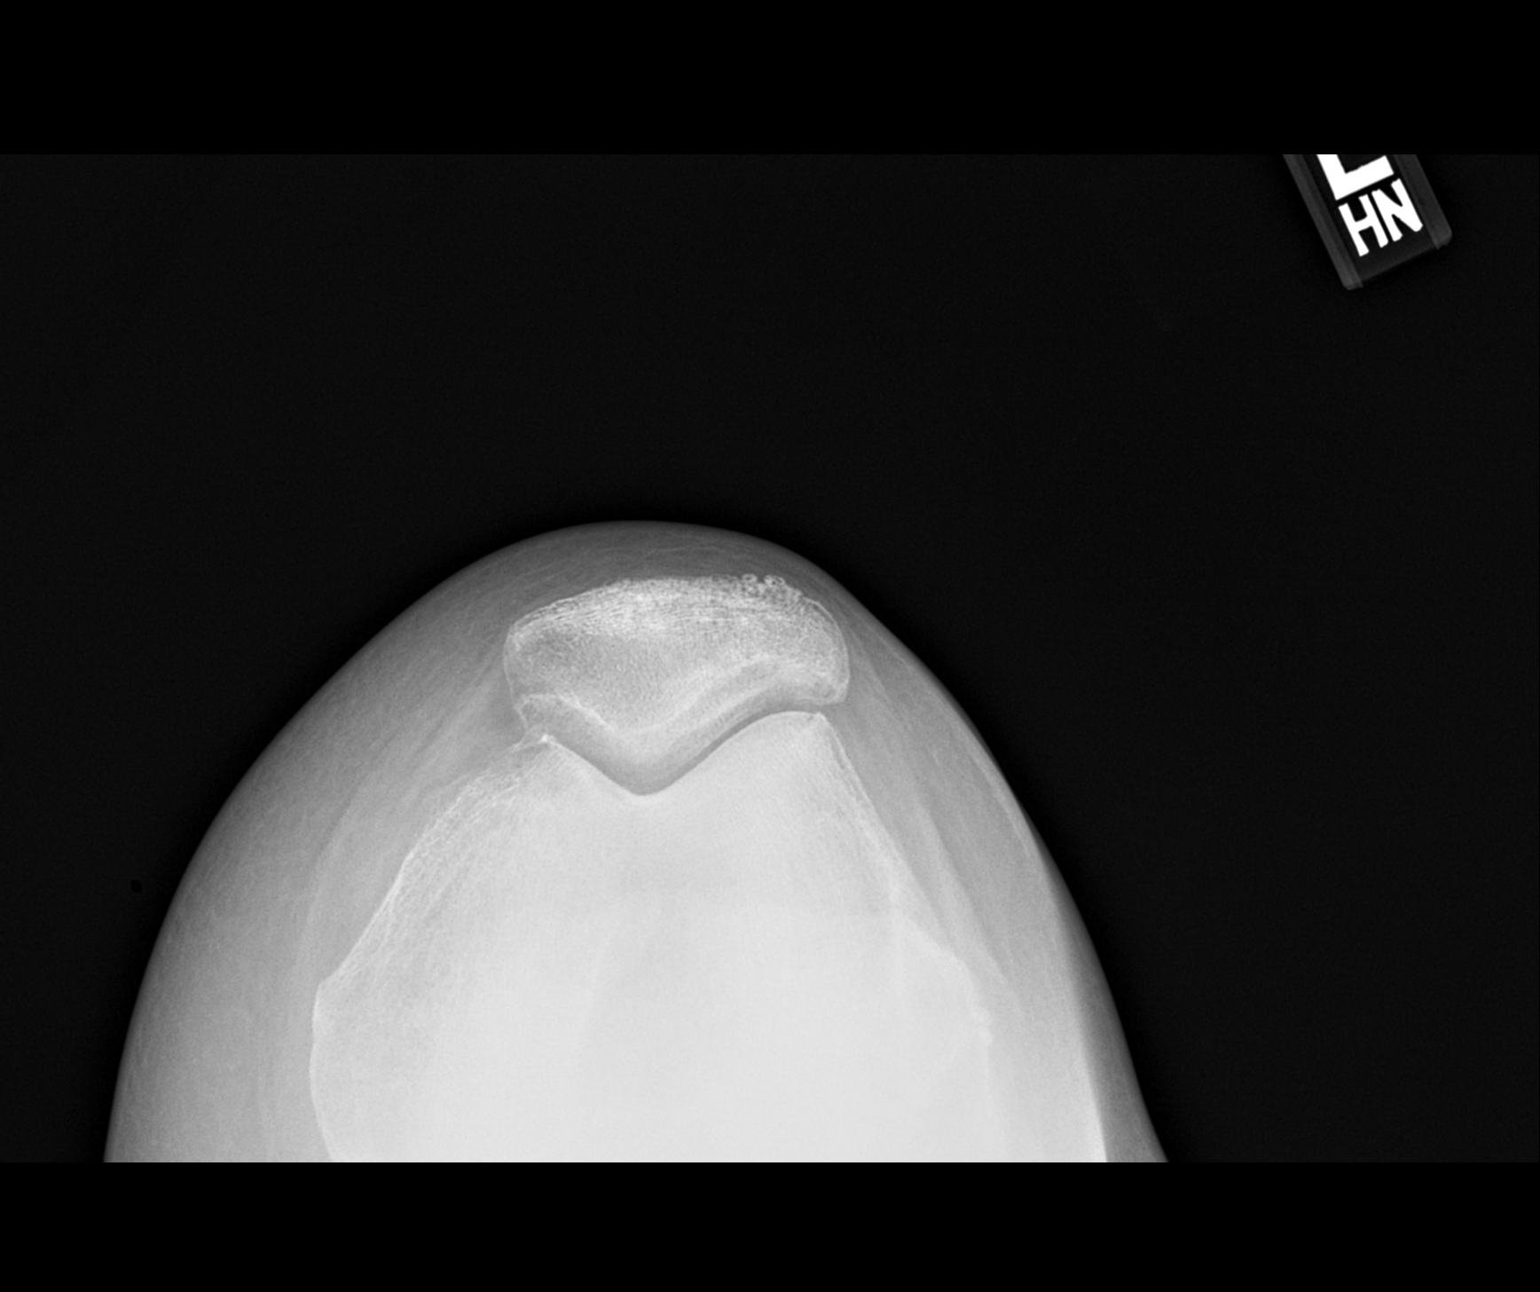

[3 of 3 positions shown; findings below may reference images not displayed]

FINDINGS: No fracture. Normal alignment. Mild tricompartmental degenerative 
change of the left knee 0.4 cm calcification lateral to the lateral femoral 
condyle. Right knee joint spaces are preserved. No effusion. Normal bone 
mineralization. Atherosclerosis.
IMPRESSION: Mild degenerative change of the left knee and normal right knee.

## 2022-04-29 IMAGING — DX KNEE 3 VIEWS RIGHT
1 series · 3 of 3 positions shown · non-contrast
Comparison: None.

________________________________________________________________________________________________ 
KNEE 3 VIEWS RIGHT, KNEE 3 VIEWS LEFT, 04/29/2022 [DATE]: 
CLINICAL INDICATION: Bilateral knee pain.

[Series 1: AP · 0.14mm/px · 3 of 3 slices shown]
[im 1/3]
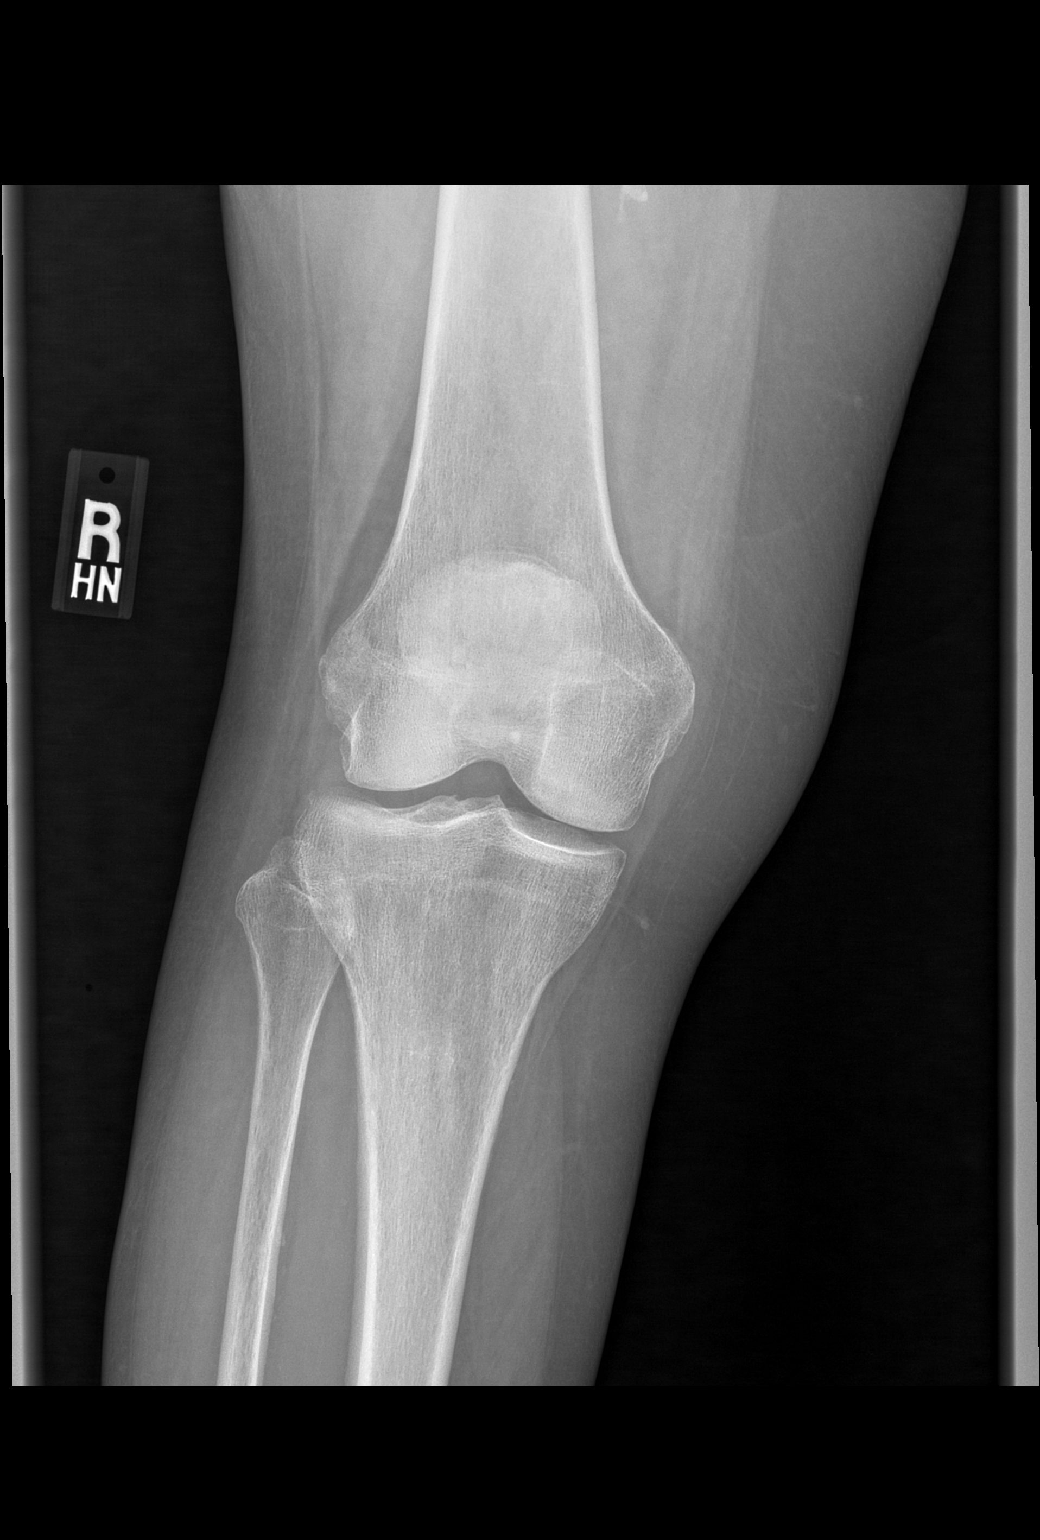
[im 2/3]
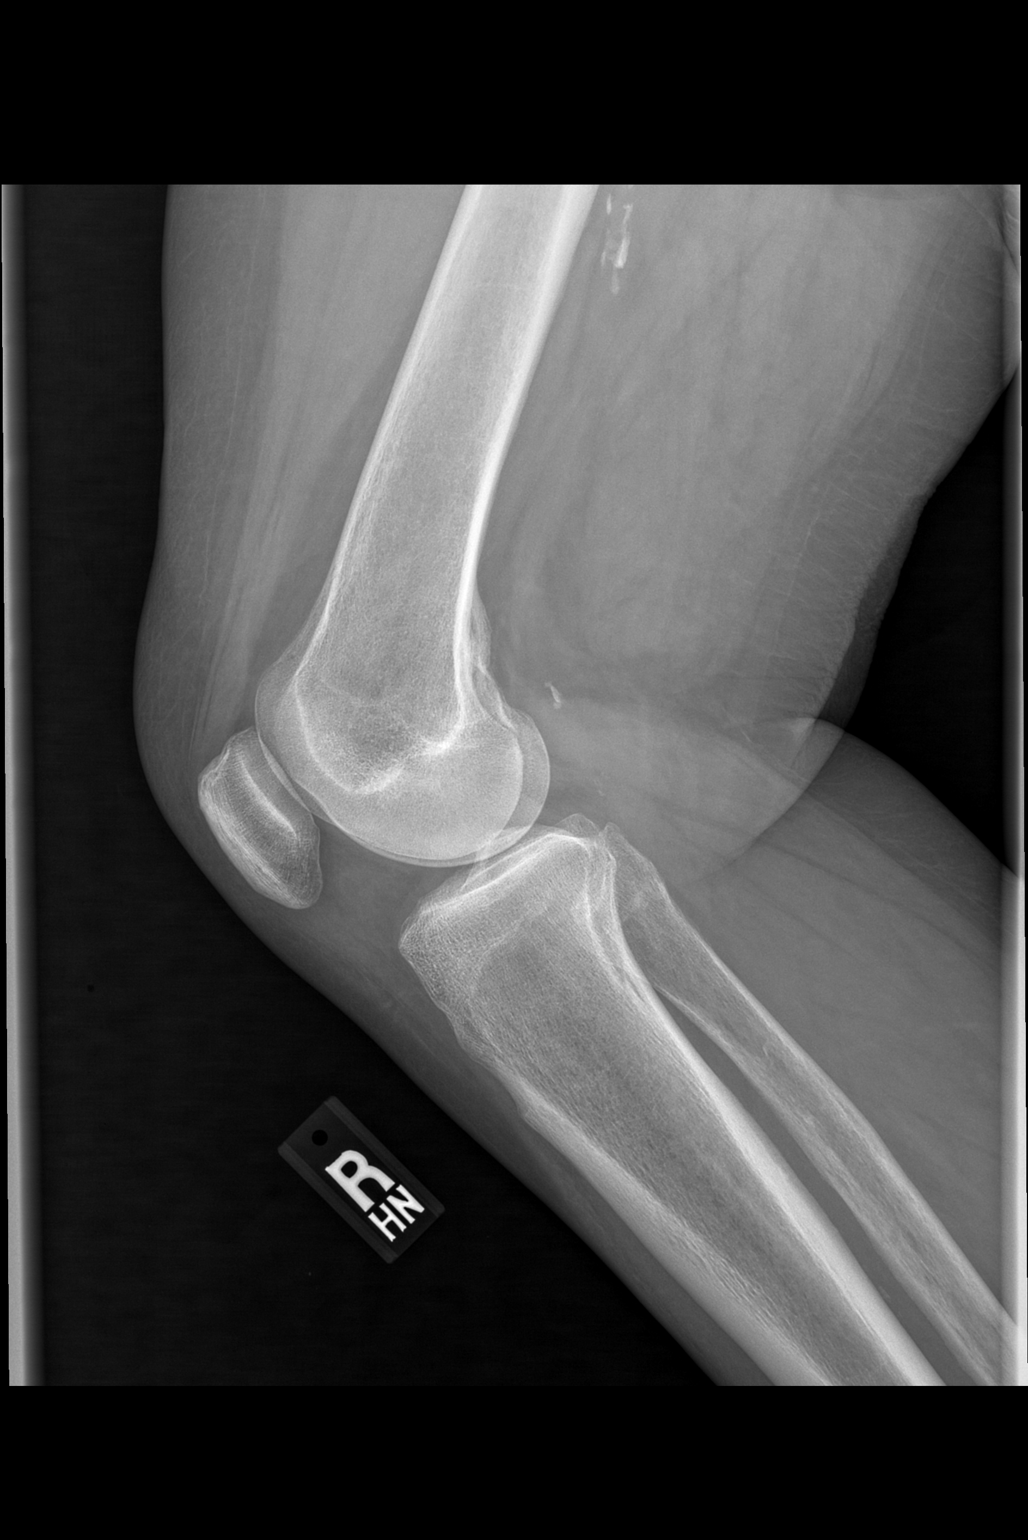
[im 3/3]
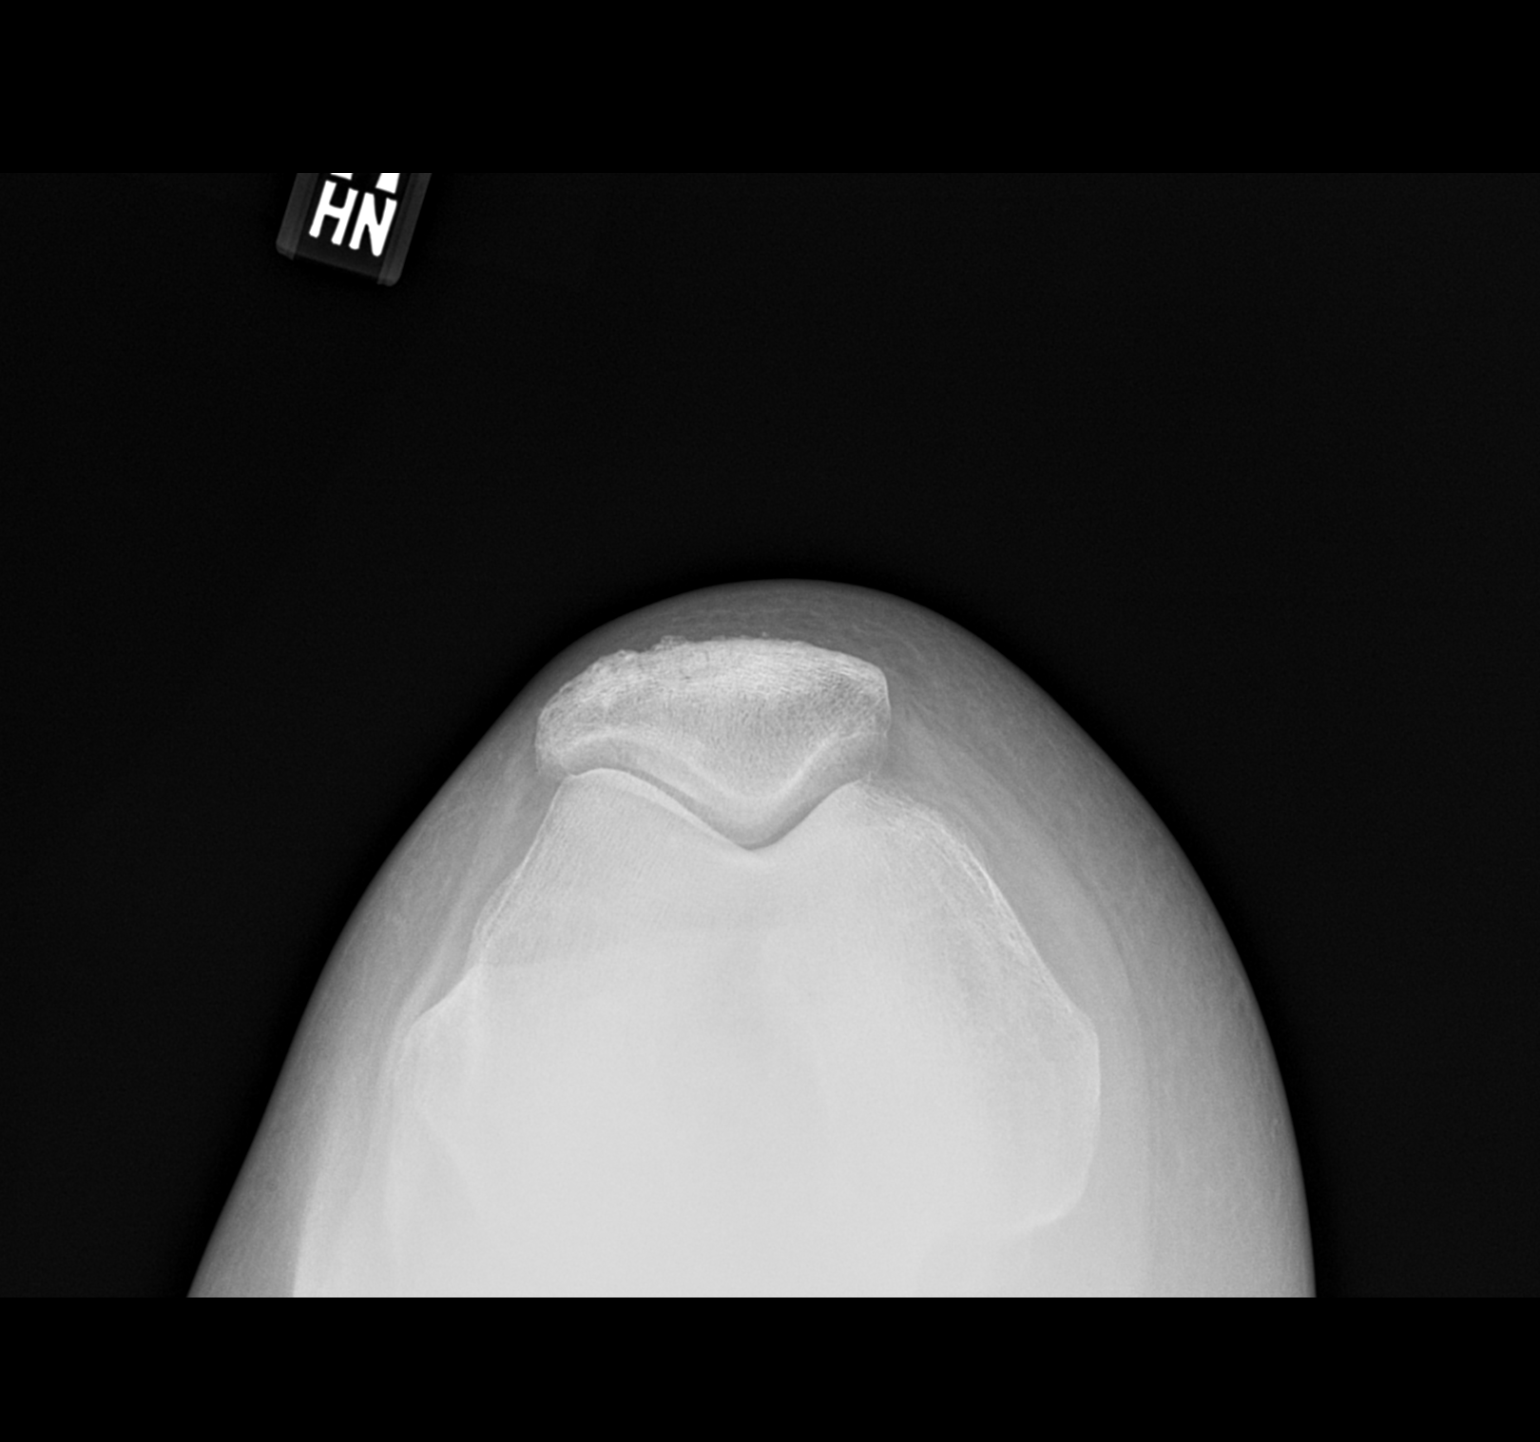

[3 of 3 positions shown; findings below may reference images not displayed]

FINDINGS: No fracture. Normal alignment. Mild tricompartmental degenerative 
change of the left knee 0.4 cm calcification lateral to the lateral femoral 
condyle. Right knee joint spaces are preserved. No effusion. Normal bone 
mineralization. Atherosclerosis.
IMPRESSION: Mild degenerative change of the left knee and normal right knee.

## 2022-06-10 IMAGING — MR MRI LEFT KNEE WITHOUT CONTRAST
4 of 5 series · 25 of 40 positions shown · IV contrast (gadolinium)
Comparison: 04/29/2022 radiographs

________________________________________________________________________________________________ 
MRI LEFT KNEE WITHOUT CONTRAST, 06/10/2022 [DATE]: 
CLINICAL INDICATION: Complex tear of medial meniscus, current injury, left knee, 
initial encounter. Chronic knee pain.
TECHNIQUE: Multiplanar, multiecho position MR images of the knee were performed 
without intravenous gadolinium enhancement. Patient was scanned on a
magnet.

[Series 301: PD fat-sat · axial · left · 3.0mm · 0.36mm/px · z∈[-85,+37]mm · 8 of 36 slices shown (1 of 3)]
[im 1/36]
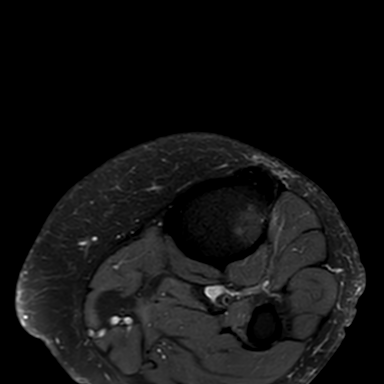
[im 4/36]
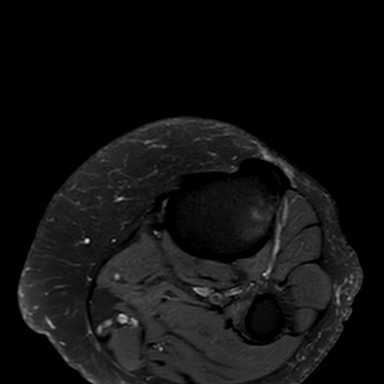
[im 12/36]
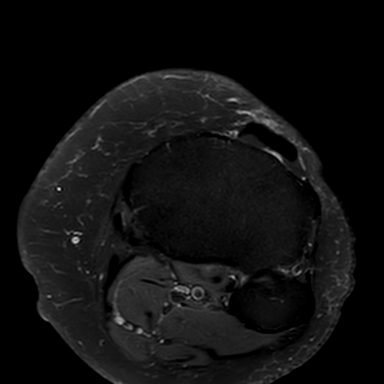
[im 16/36]
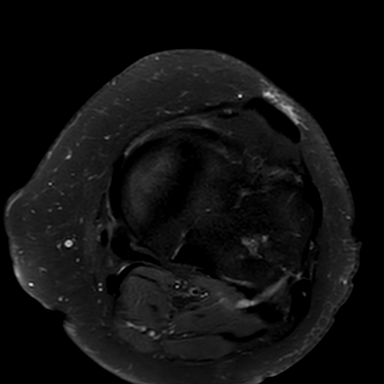
[im 20/36]
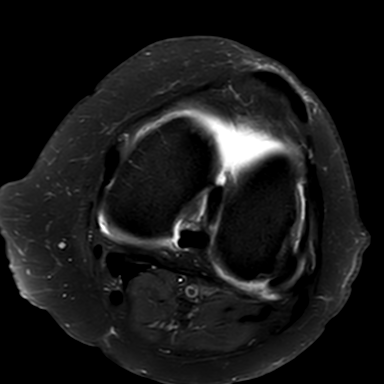
[im 24/36]
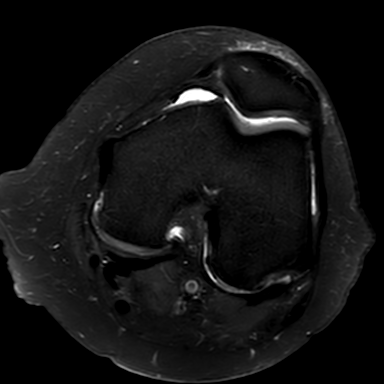
[im 32/36]
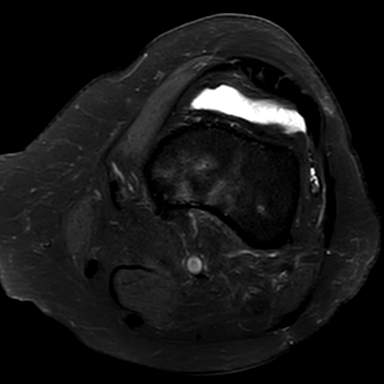
[im 36/36]
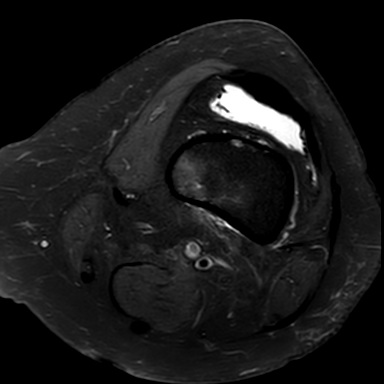

[Series 401: PD fat-sat · sagittal · left · 3.0mm · 0.29mm/px · 9 of 32 slices shown (2 of 3)]
[im 1/32]
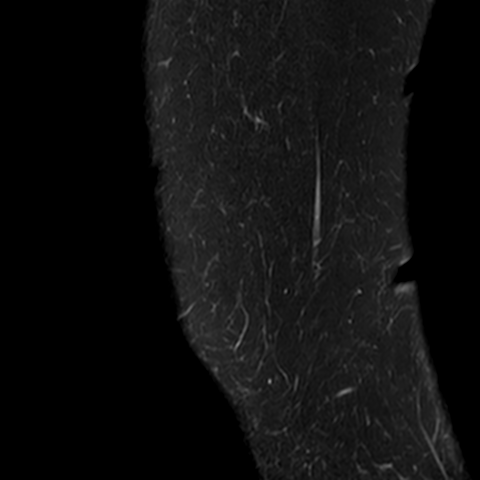
[im 4/32]
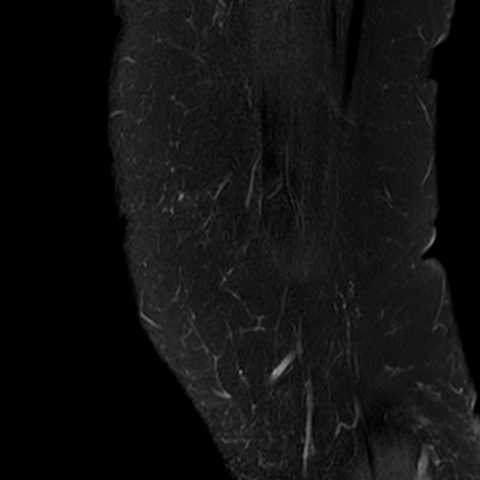
[im 8/32]
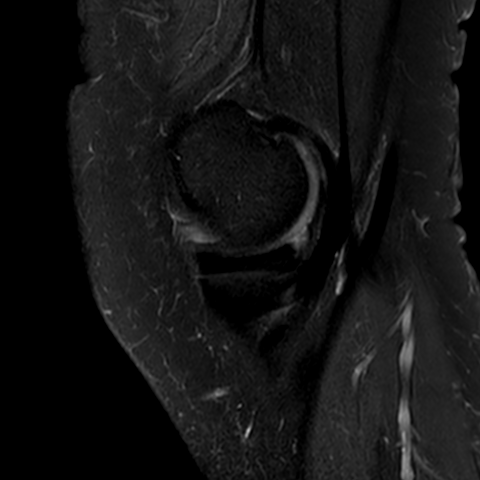
[im 12/32]
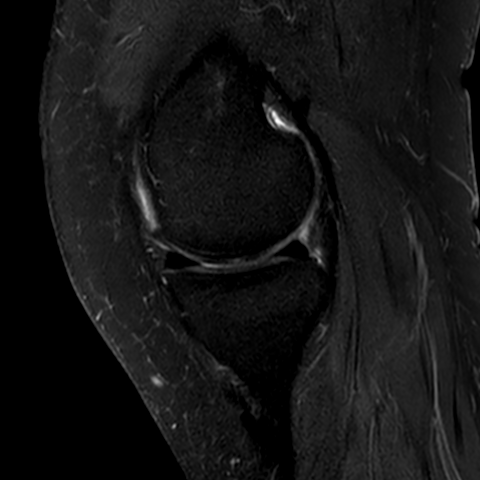
[im 16/32]
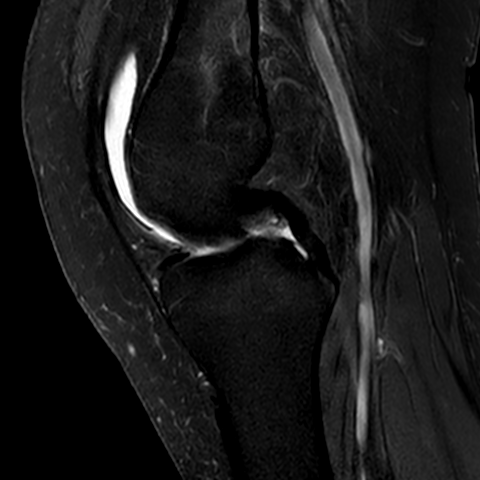
[im 20/32]
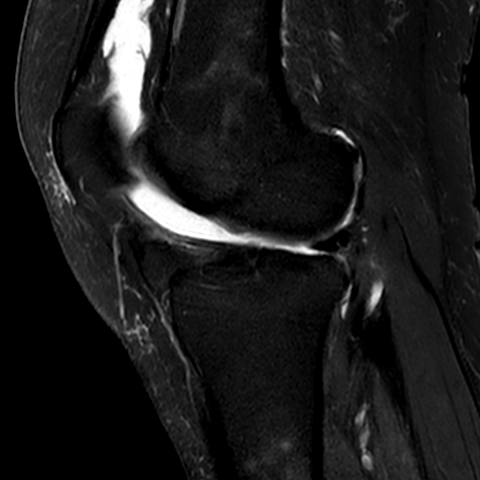
[im 24/32]
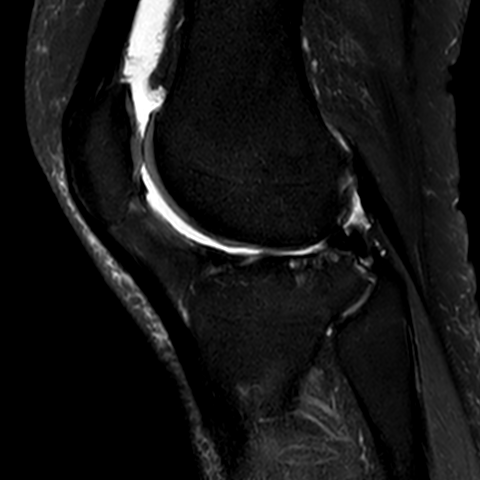
[im 28/32]
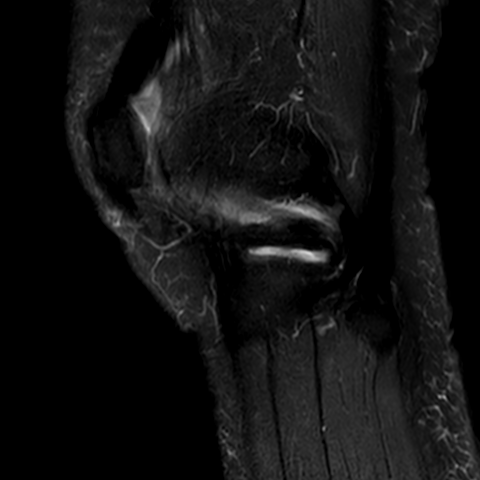
[im 32/32]
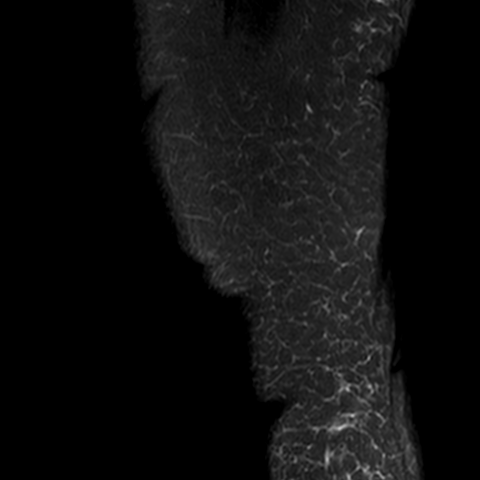

[Series 501: T1 · coronal · left · 3.0mm · 0.31mm/px · 3 of 32 slices shown]
[im 4/32]
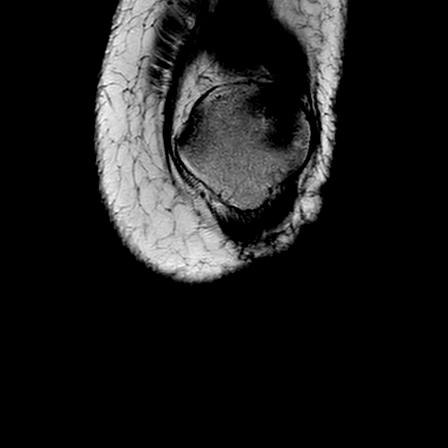
[im 16/32]
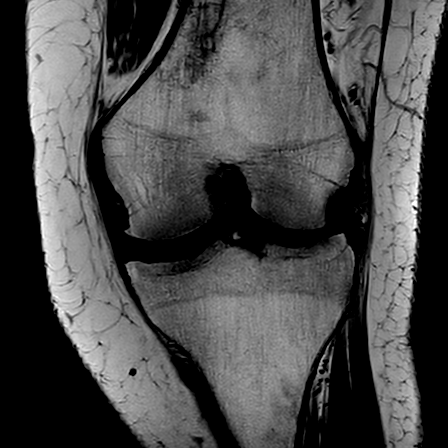
[im 28/32]
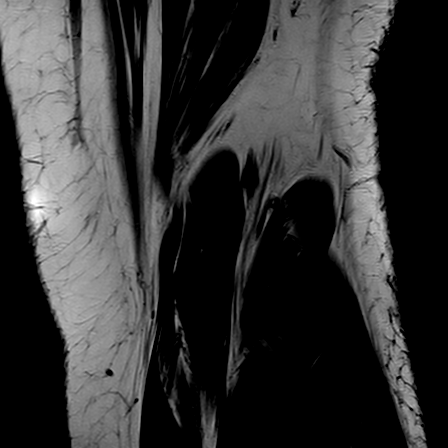

[Series 601: PD fat-sat · coronal · left · 3.0mm · 0.31mm/px · 5 of 32 slices shown (3 of 3)]
[im 1/32]
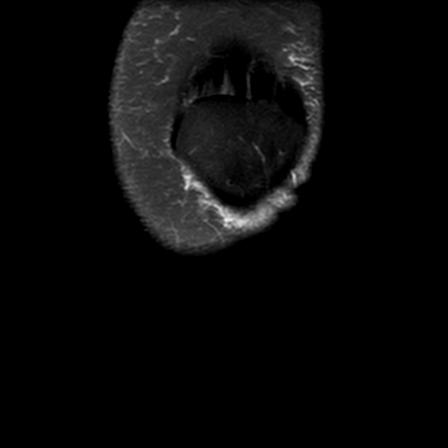
[im 4/32]
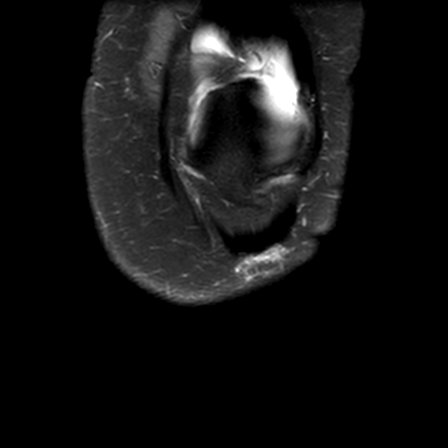
[im 8/32]
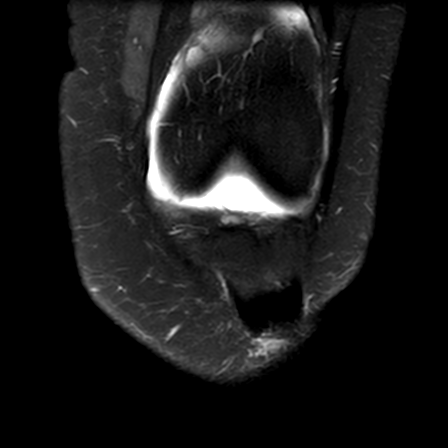
[im 16/32]
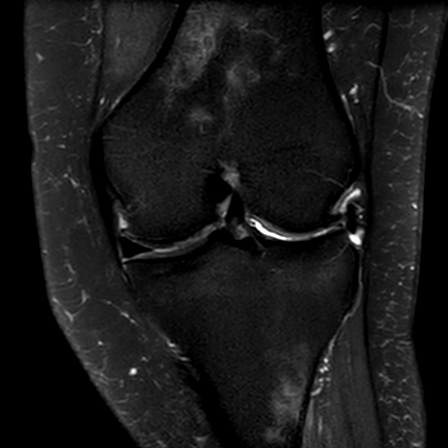
[im 28/32]
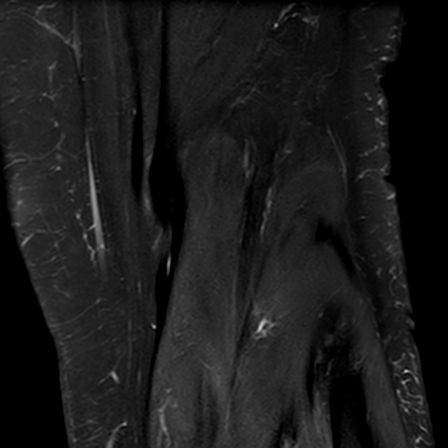

[25 of 40 positions shown; findings below may reference images not displayed]

FINDINGS: MEDIAL COMPARTMENT: The medial meniscus is intact without tear or extrusion. 
Small focus of grade III chondromalacia of the medial femoral condyle and grade 
II chondromalacia of the remaining joint. Tiny marginal osteophytes. 
LATERAL COMPARTMENT: Complex tear of the lateral meniscal body and complex 
tear/truncation of the anterior horn. Lateral meniscal extrusion. Up to grade IV 
chondromalacia, subcortical cystic change and lateral tibial plateau 
findings/mild concavity. Marginal osteophytes. 
PATELLOFEMORAL COMPARTMENT: The patella is centrally located. Up to grade II 
chondromalacia patella. No focal trochlear cartilaginous defects. 
TIBIOFIBULAR COMPARTMENT: Negative. 
LIGAMENTS: The anterior cruciate ligament is intact. The posterior cruciate 
ligament is intact. The medial collateral ligament and lateral collateral 
ligaments are preserved. 
EXTENSOR MECHANISM: The quadriceps and patellar tendon are preserved. The medial 
and lateral retinacula are intact. 
POSTEROMEDIAL CORNER: The semimembranosus and pes anserine tendons are 
preserved. The posterior oblique ligament and posterior medial joint capsule are 
intact. 
POSTEROLATERAL CORNER: The popliteal tendon and popliteofibular ligament are 
intact. The biceps femoris is negative. 
BONES: No fracture or contusion or stress response.  0.8 cm medial to distal 
femoral metaphyseal chondroid rest. 
ADDITIONAL FINDINGS: Moderate joint effusion. No popliteal cyst. The musculature 
is normal without mass, signal abnormality or atrophy. Neurovascular bundles are 
negative. Subcutaneous tissues are negative.
IMPRESSION: 1.  Complex tear of the lateral meniscal body, complex tear/truncation of the 
anterior horn and lateral meniscal extrusion.  
2.  Marked lateral compartment and mild medial compartment/patellar 
chondromalacia. 
3.  Moderate joint effusion.

## 2022-06-10 IMAGING — MR MRI RIGHT KNEE WITHOUT CONTRAST
4 of 5 series · 25 of 40 positions shown · IV contrast (gadolinium)
Comparison: 04/29/2022 radiographs

________________________________________________________________________________________________ 
MRI RIGHT KNEE WITHOUT CONTRAST, 06/10/2022 [DATE]: 
CLINICAL INDICATION: Complex tear of medial meniscus, current injury, right 
knee, initial encounter. Contusion of right knee.
TECHNIQUE: Multiplanar, multiecho position MR images of the knee were performed 
without intravenous gadolinium enhancement. Patient was scanned on a
magnet.

[Series 301: PD fat-sat · axial · right · 3.0mm · 0.36mm/px · z∈[-69,+53]mm · 8 of 36 slices shown (1 of 3)]
[im 1/36]
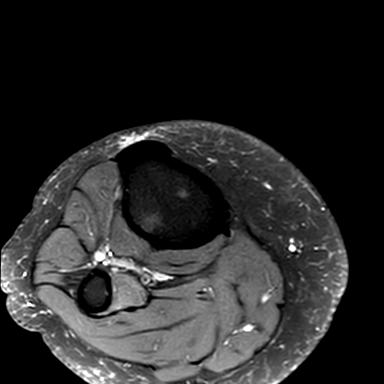
[im 4/36]
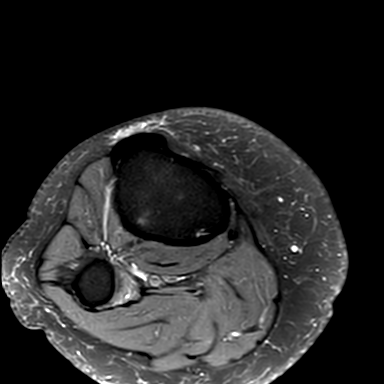
[im 12/36]
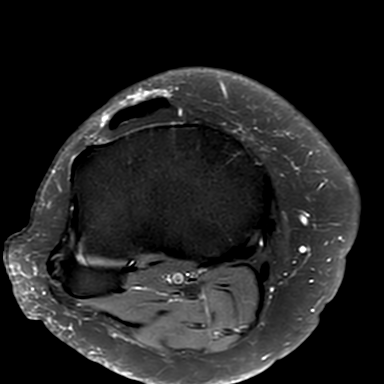
[im 16/36]
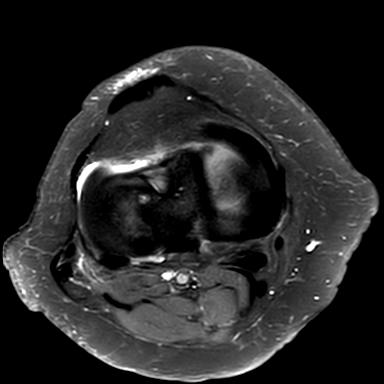
[im 20/36]
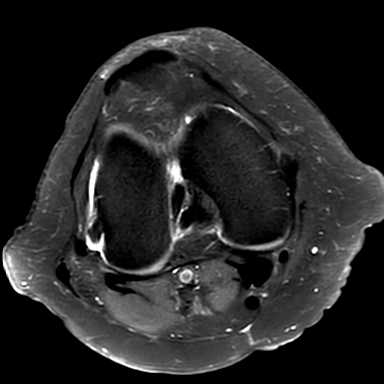
[im 24/36]
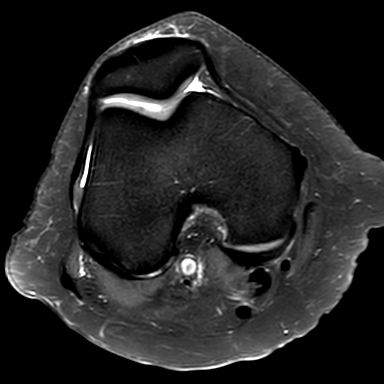
[im 32/36]
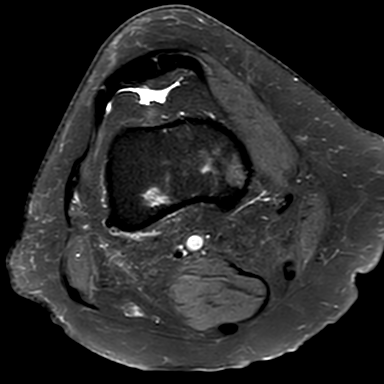
[im 36/36]
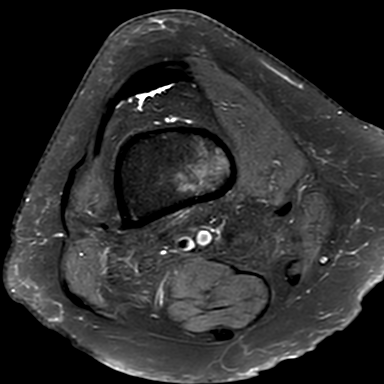

[Series 401: PD fat-sat · sagittal · right · 3.0mm · 0.29mm/px · 9 of 32 slices shown (2 of 3)]
[im 1/32]
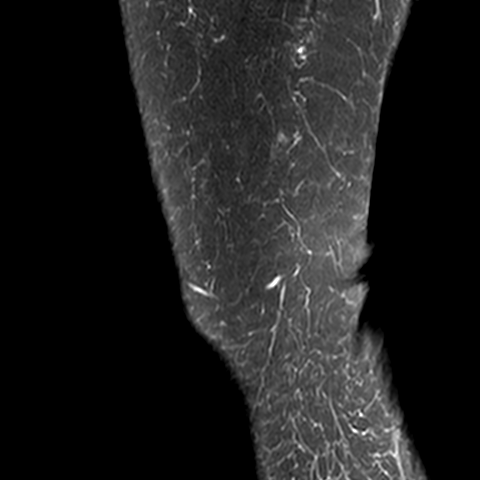
[im 4/32]
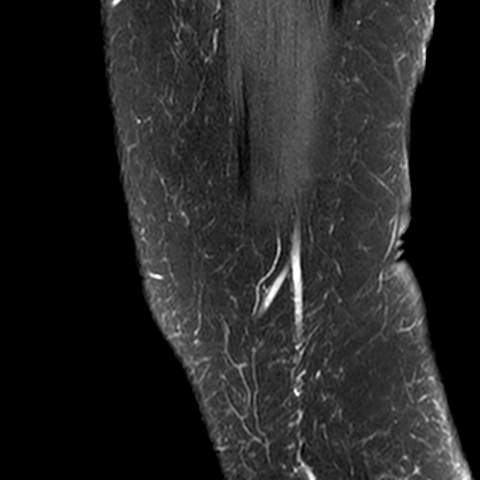
[im 8/32]
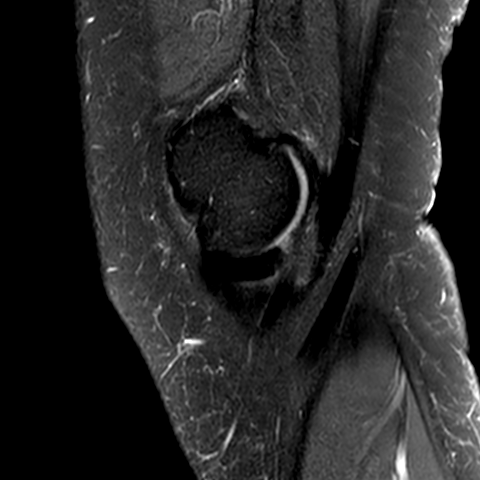
[im 12/32]
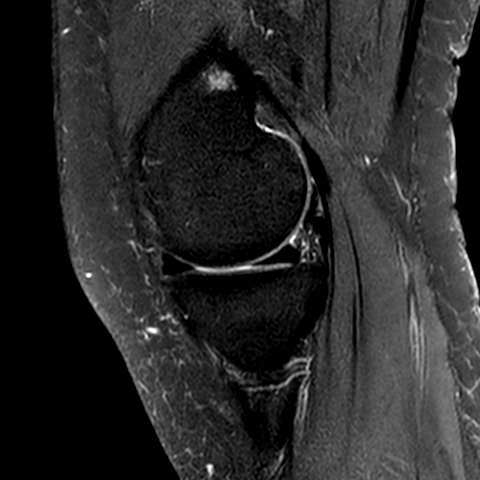
[im 16/32]
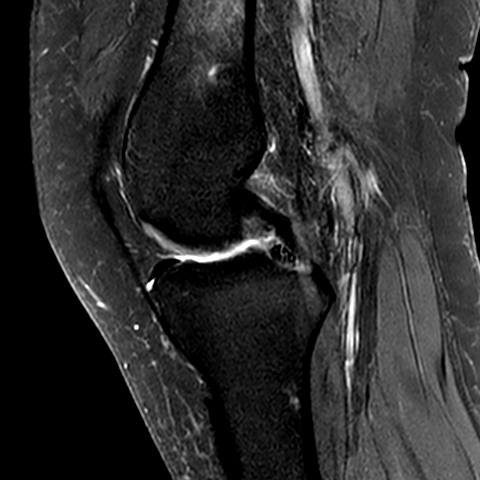
[im 20/32]
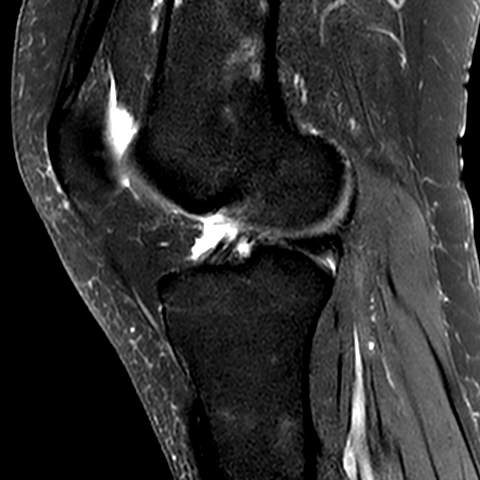
[im 24/32]
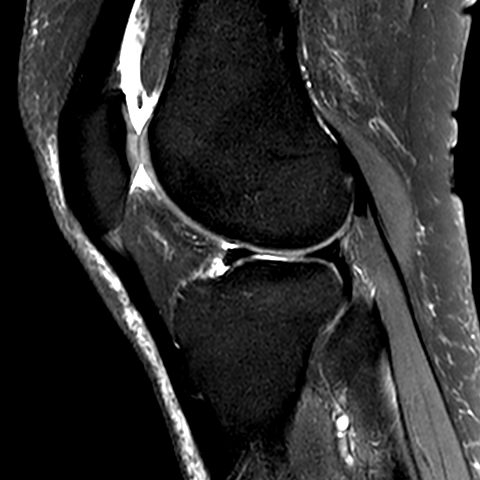
[im 28/32]
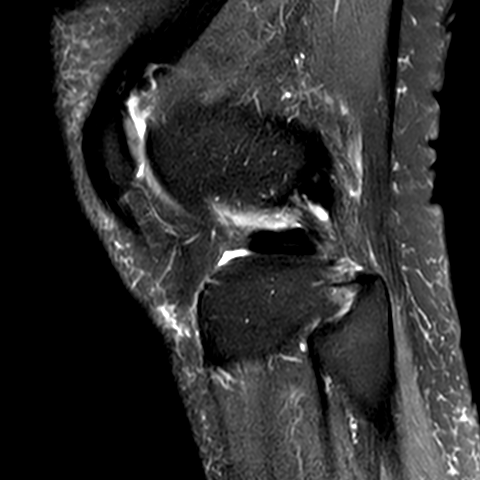
[im 32/32]
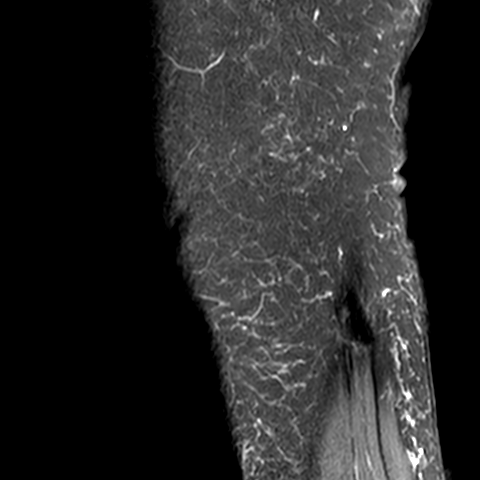

[Series 501: T1 · coronal · right · 3.0mm · 0.31mm/px · 3 of 32 slices shown]
[im 4/32]
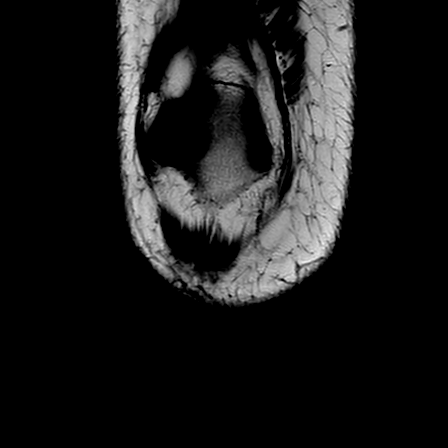
[im 16/32]
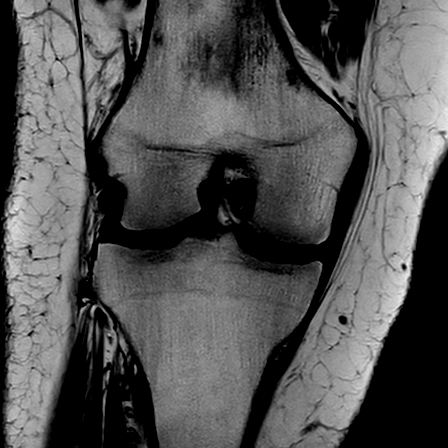
[im 28/32]
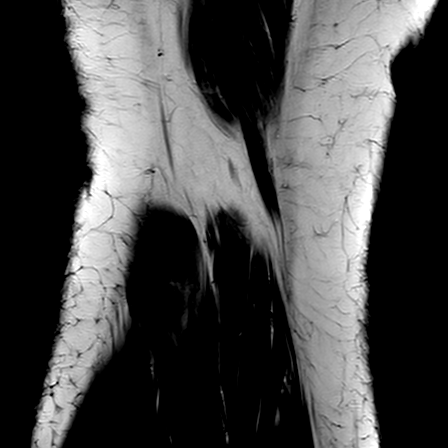

[Series 601: PD fat-sat · coronal · right · 3.0mm · 0.31mm/px · 5 of 32 slices shown (3 of 3)]
[im 1/32]
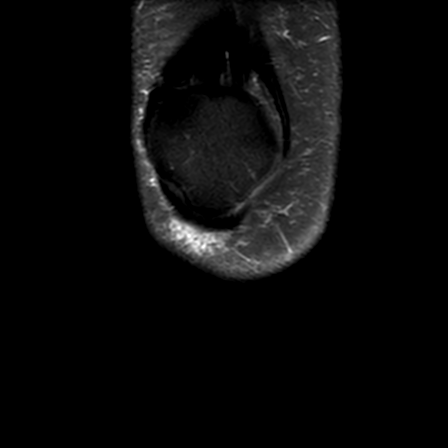
[im 4/32]
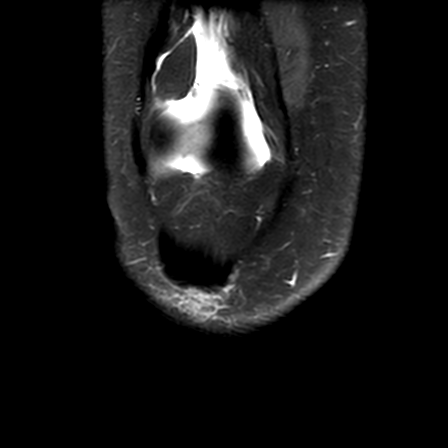
[im 8/32]
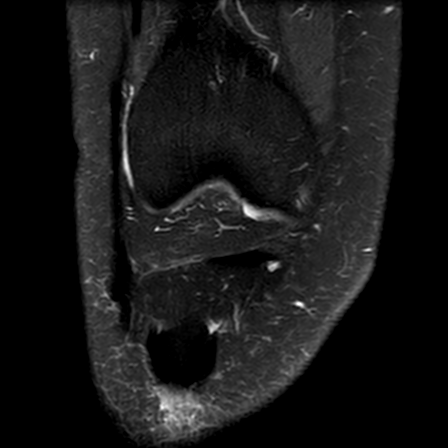
[im 16/32]
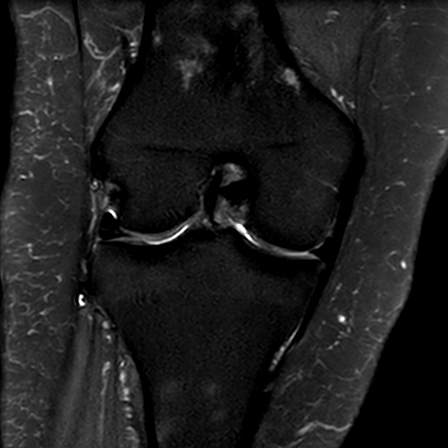
[im 28/32]
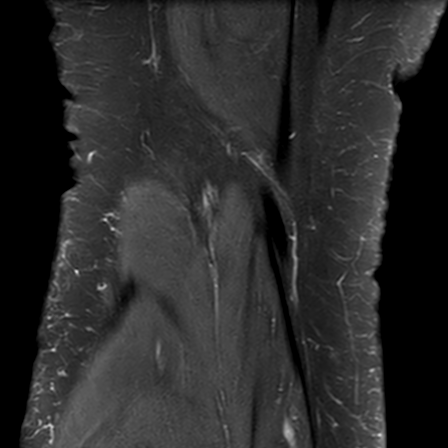

[25 of 40 positions shown; findings below may reference images not displayed]

FINDINGS: MEDIAL COMPARTMENT: The medial meniscus is intact without tear or extrusion. 
Striations in the posterior medial meniscal root ligament. Up to grade II 
chondromalacia. 
LATERAL COMPARTMENT: Horizontal tear of the lateral meniscal body. No lateral 
meniscal extrusion. Up to grade IV chondromalacia and subcortical cystic change 
of the lateral tibial plateau and grade II chondromalacia of the femoral 
condyle. 
PATELLOFEMORAL COMPARTMENT: The patella is centrally located. Up to grade II 
chondromalacia. 
TIBIOFIBULAR COMPARTMENT: Negative. 
LIGAMENTS: The anterior cruciate ligament is intact. The posterior cruciate 
ligament is intact. The medial collateral ligament and lateral collateral 
ligaments are preserved. 
EXTENSOR MECHANISM: The quadriceps and patellar tendon are preserved. The medial 
and lateral retinacula are intact. 
POSTEROMEDIAL CORNER: The semimembranosus and pes anserine tendons are 
preserved. The posterior oblique ligament and posterior medial joint capsule are 
intact. 
POSTEROLATERAL CORNER: The popliteal tendon and popliteofibular ligament are 
intact. The biceps femoris is negative. 
BONES: Normal bone marrow signal intensity. No fracture or contusion or stress 
response.  
ADDITIONAL FINDINGS: Small joint effusion and mild synovitis. No popliteal cyst. 
The musculature is normal without mass, signal abnormality or atrophy. 
Neurovascular bundles are negative. Mild subcutaneous soft tissue swelling.
IMPRESSION: 1.  No medial meniscal tear. Striations in the posterior medial meniscal root 
ligament.  
2.  Horizontal tear of the lateral meniscal body.  
3.  Moderate lateral compartment and mild medial compartment/patellofemoral 
chondromalacia. 
4.  Small joint effusion and mild synovitis. 
5.  Mild subcutaneous soft tissue swelling.

## 2022-08-24 IMAGING — MR MRI BRAIN WITHOUT CONTRAST
8 of 12 series · 23 of 48 positions shown · IV contrast (gadolinium)
Comparison: Skeletal survey February 17, 2022. Skeletal survey February 14, 2021.

________________________________________________________________________________________________ 
MRI BRAIN WITHOUT CONTRAST, 08/24/2022 [DATE]: 
CLINICAL INDICATION: Severe Vertigo , dizziness for one year.
TECHNIQUE: Multiplanar, multiecho position MR images of the brain were performed 
without intravenous gadolinium enhancement. Patient was scanned on a
magnet.

[Series 102: mpr - smartbrain · axial · 1.1mm · 1.09mm/px · z∈[+0,+174]mm · 2 of 2 slices shown]
[im 1/2]
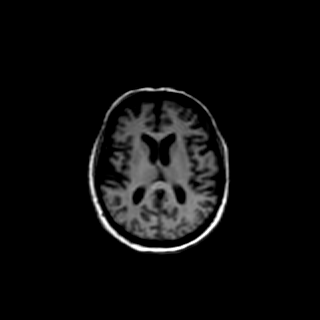
[im 2/2]
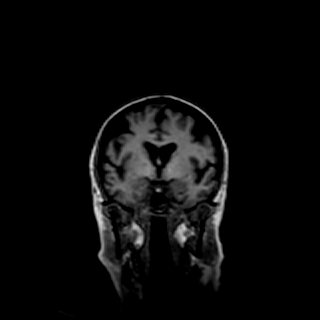

[Series 203: dadc map · axial · 5.0mm · 1.00mm/px · z∈[-99,+57]mm · 2 of 26 slices shown (1 of 2)]
[im 1/26]
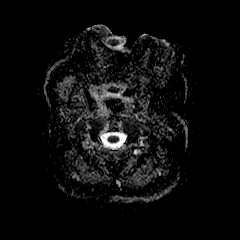
[im 26/26]
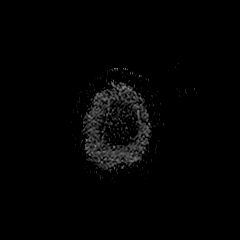

[Series 204: (id) · axial · 5.0mm · 1.00mm/px · z∈[-99,+57]mm · 2 of 27 slices shown (1 of 2)]
[im 1/27]
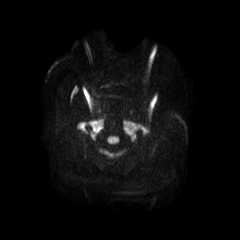
[im 27/27]
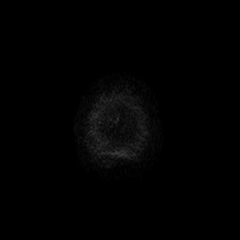

[Series 303: dadc map · coronal · 5.0mm · 0.81mm/px · 2 of 29 slices shown (2 of 2)]
[im 1/29]
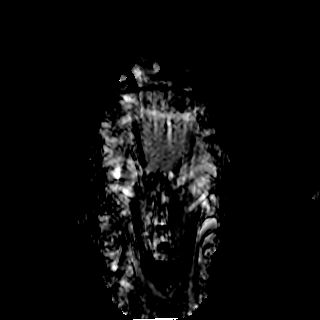
[im 29/29]
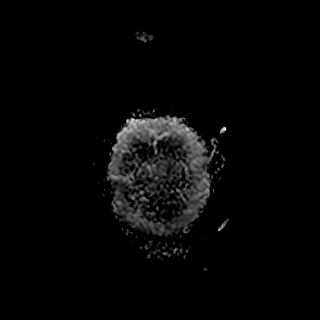

[Series 304: (id) · coronal · 5.0mm · 0.81mm/px · 2 of 29 slices shown (2 of 2)]
[im 1/29]
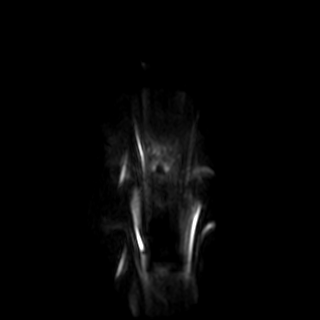
[im 29/29]
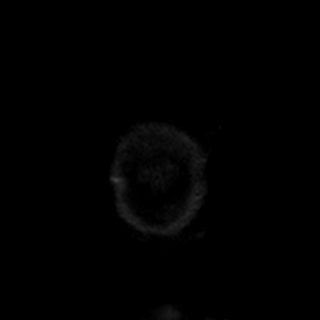

[Series 401: t1_se_sag · sagittal · 4.0mm · 0.43mm/px · 2 of 28 slices shown]
[im 1/28]
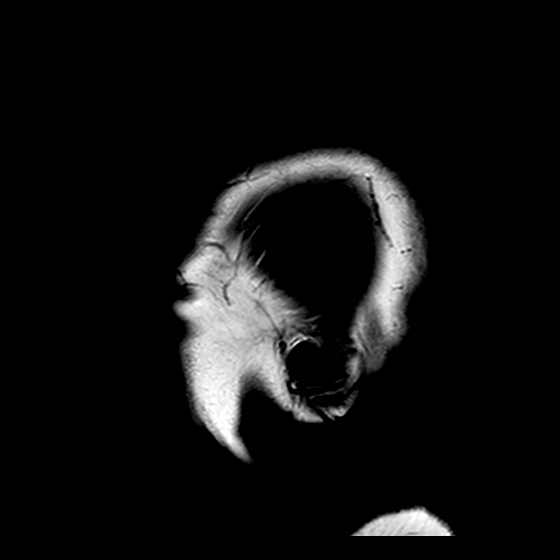
[im 28/28]
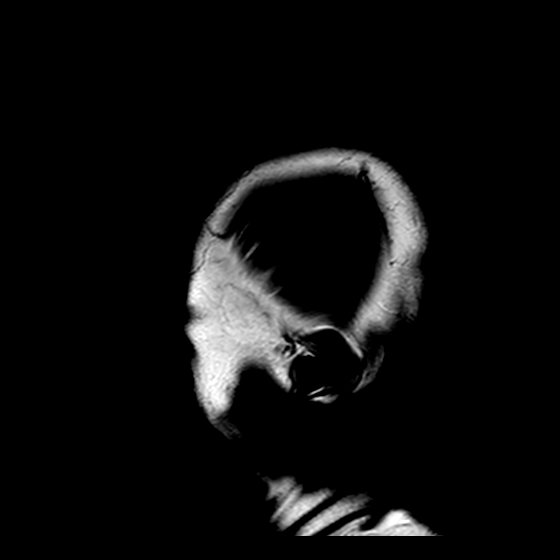

[Series 501: flair_ax+fs · axial · 5.0mm · 0.49mm/px · 1 of 27 slices shown]
[im 1/27]
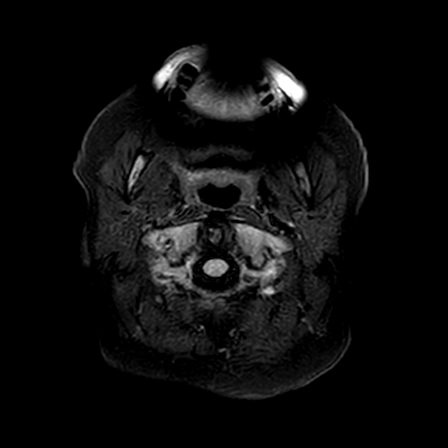

[Series 701: SWI · axial · 3.0mm · 0.40mm/px · z∈[-84,+55]mm · 10 of 200 slices shown]
[im 14/200]
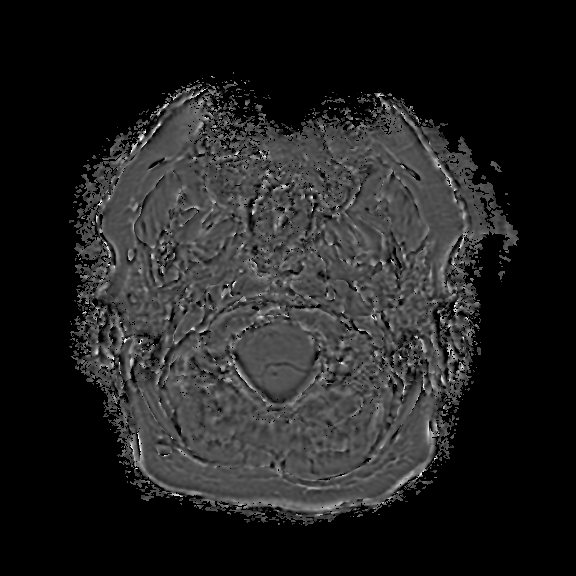
[im 27/200]
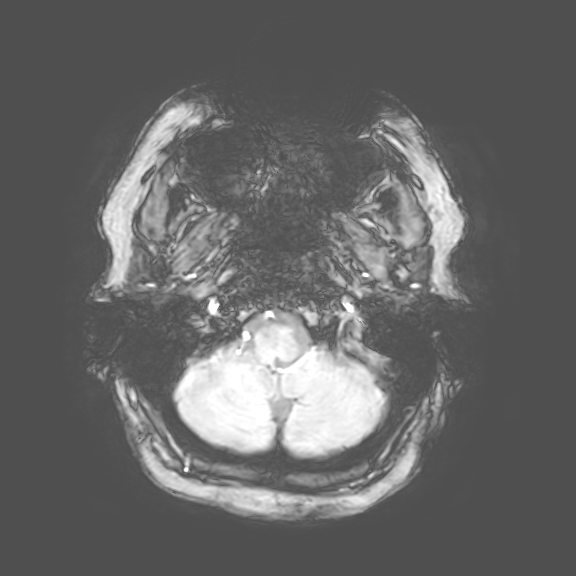
[im 40/200]
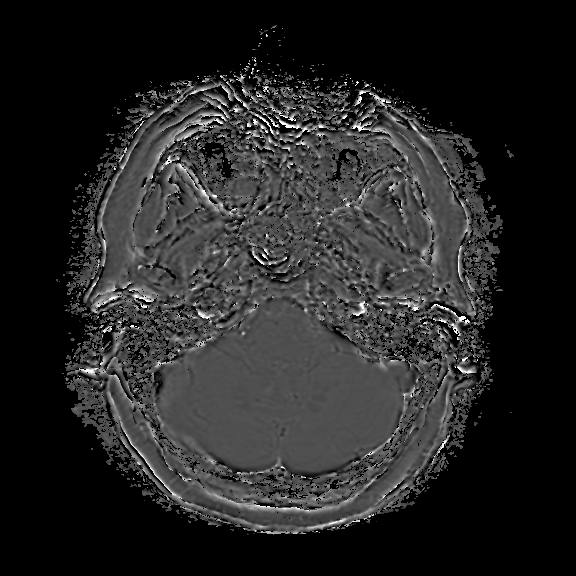
[im 67/200]
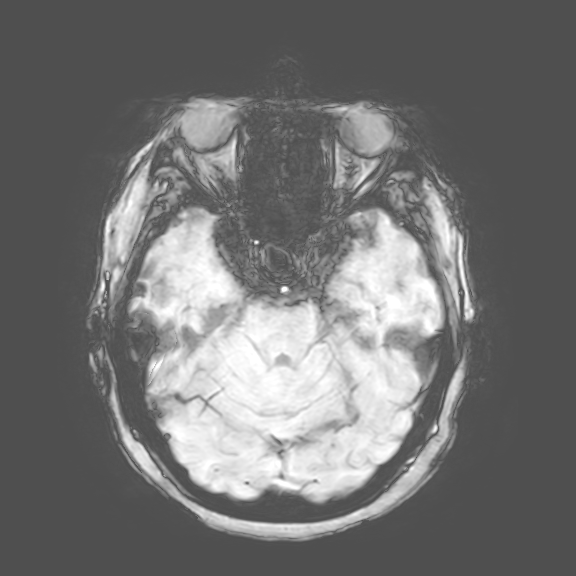
[im 93/200]
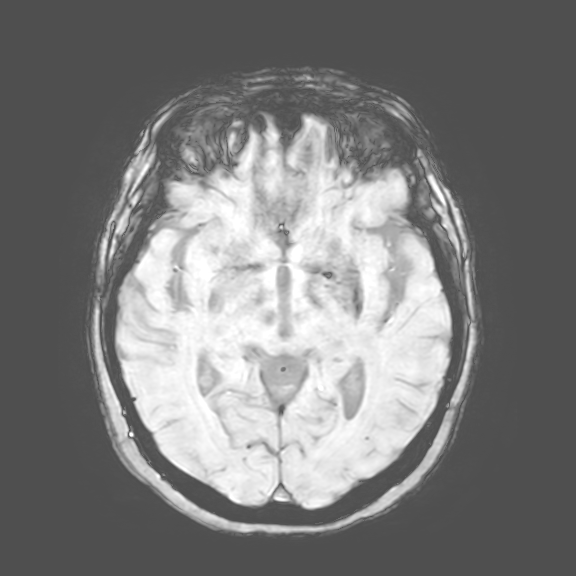
[im 107/200]
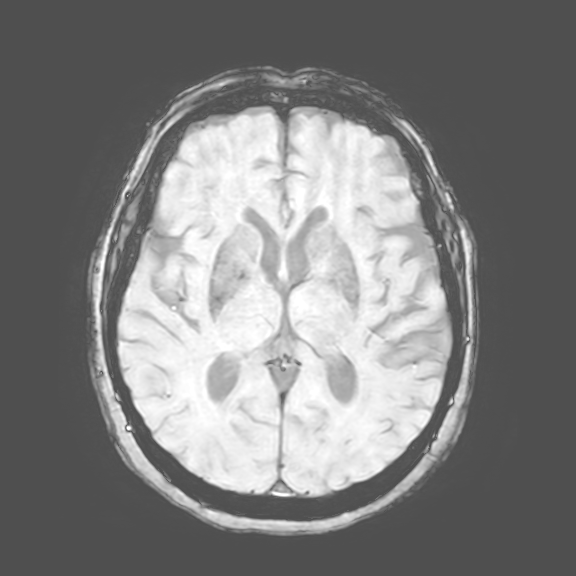
[im 120/200]
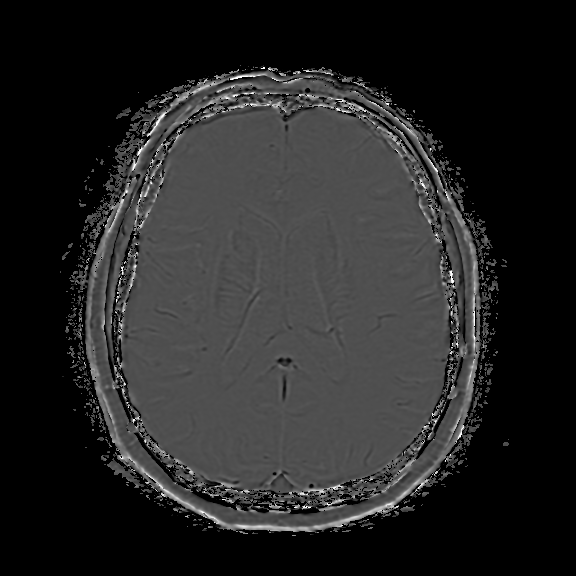
[im 146/200]
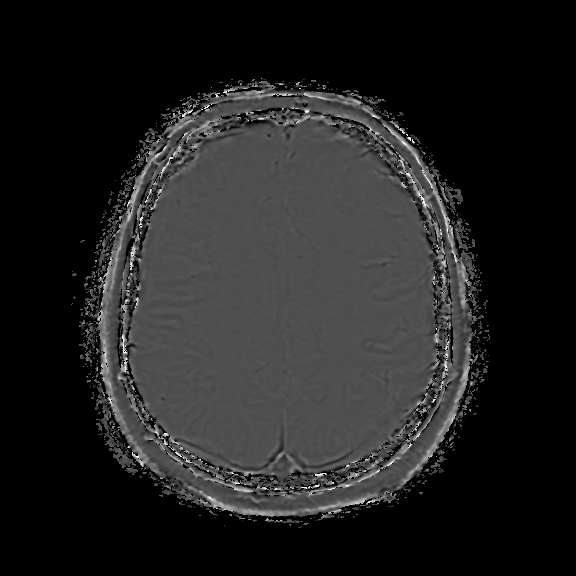
[im 173/200]
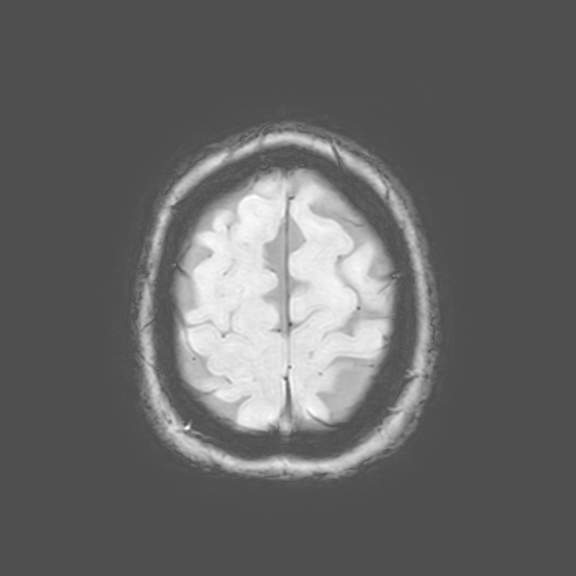
[im 200/200]
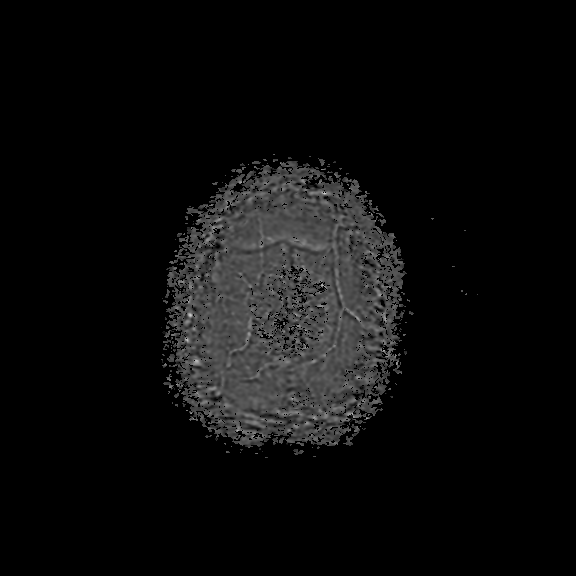

[23 of 48 positions shown; findings below may reference images not displayed]

FINDINGS: -------------------------------------------------------------------------------- 
------------------------- 
INTRACRANIAL: 
There is a focus of gliosis involving the cortex of the left occipital lobe, 
measuring 8 mm, image 16 series 501 which may reflect a small focus of chronic 
ischemic change in this region. Otherwise there is a mild degree of nonspecific 
periventricular and deep white matter T2 FLAIR hyperintensity likely reflective 
of chronic microangiopathy. No mass or abnormal extra-axial fluid collection. 
Old appearing 4 mm left cerebellar hemispheric infarct. There are perivascular 
spaces within the right basal ganglia. 
No acute ischemia. No abnormal foci of susceptibility artifact in the brain. 
Patency of the intracranial vascular flow voids.  No acute intracranial 
hemorrhage, mass effect, midline shift. No large sellar mass. No hydrocephalus. 
Cerebral volume is age appropriate.  
-------------------------------------------------------------------------------- 
----------------------- 
OTHER: 
ORBITS/SINUSES/T-BONES:  Bilateral pseudophakia.  Minimal scattered fluid within 
the left mastoid air cells.  Visualized paranasal sinuses are clear. 
MARROW SIGNAL/SOFT TISSUES: There is abnormal T1 hypointense marrow signal in 
the clivus, skull base, portions of the visualized cervical spine, mandible, and 
calvarium. This is consistent with marrow infiltrative process and could suggest 
myelomatous infiltration. 
-------------------------------------------------------------------------------- 
-------------------
IMPRESSION: No acute intracranial process. 
Evidence of a small focus of gliosis involving the left occipital lobe cortex 
likely related to chronic ischemic change, with an old, 4 mm left cerebellar 
hemispheric infarct. 
Abnormal marrow signal could reflect myelomatous infiltration.

## 2022-11-12 IMAGING — CT CT LUNG SCREENING
2 of 4 series · 7 of 36 positions shown, 8 images · non-contrast
Comparison: 11/11/2021   
Count of known CT and Cardiac Nuclear Medicine studies performed in the previous 
12 months = 0.

________________________________________________________________________________________________ 
CT LUNG SCREENING, 11/12/2022 [DATE]: 
CLINICAL INDICATION: Personal history of nicotine dependence. 67.5 pack year 
smoking history. Patient is a former smoker. 
A search for DICOM formatted images was conducted for prior CT imaging studies 
completed at a non-affiliated media free facility.
TECHNIQUE: The chest was scanned from base of neck through the lung bases 
without contrast on a high resolution low dose CT scanner. Routine MPR and MIP 
reconstruction images were performed.

[Series 4: coronal · coronal · 0.59mm/px · 3 of 132 slices shown]
[im 27/132  lung]
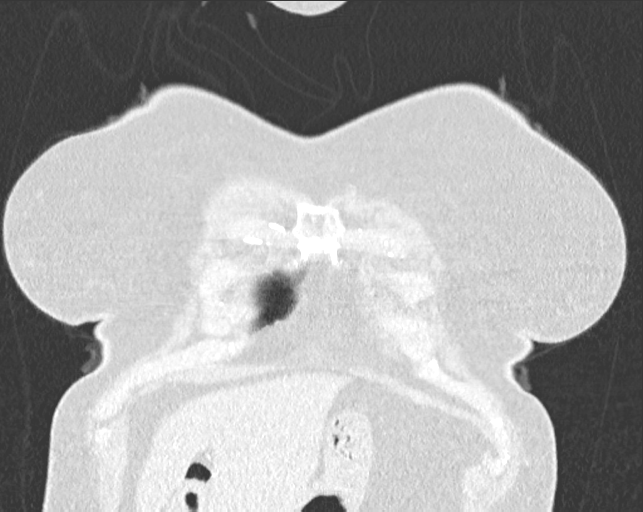
[im 53/132  lung]
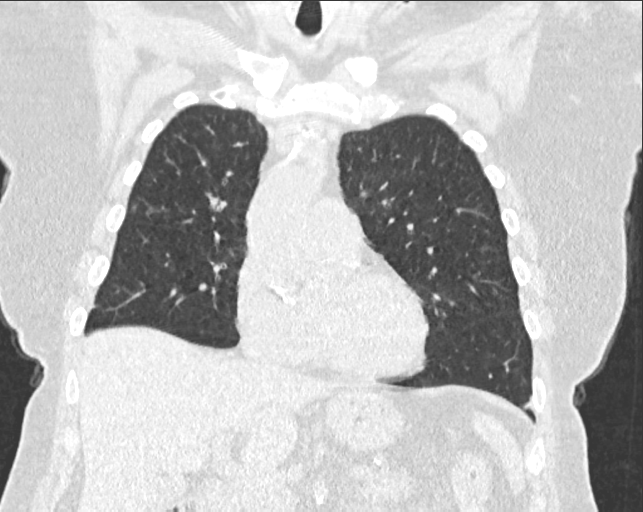
[im 79/132  lung]
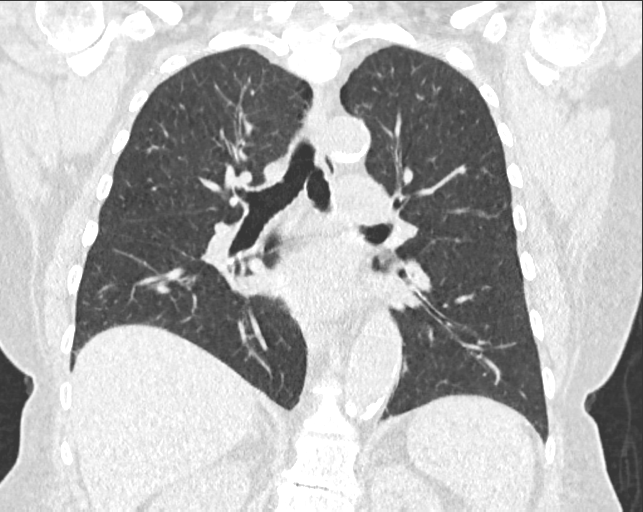

[Series 6: lung mips · axial · 0.54mm/px · z∈[-241,-82]mm · 4 of 39 slices shown, 5 images]
[im 8/39  mediastinal]
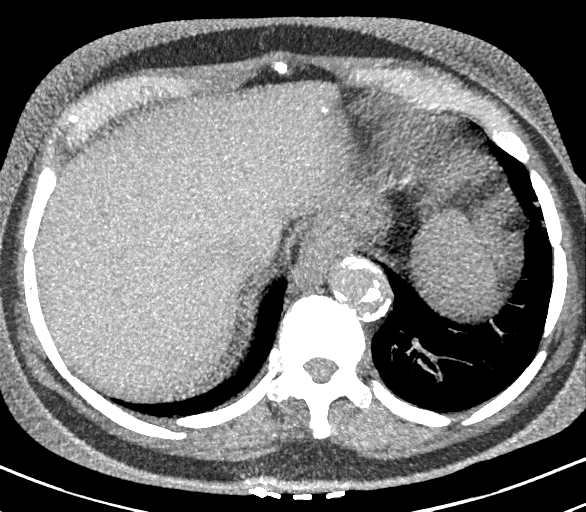
[im 8/39  lung]
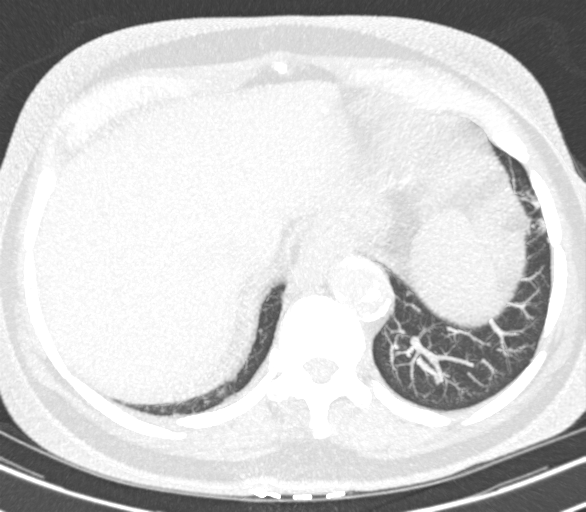
[im 16/39  lung]
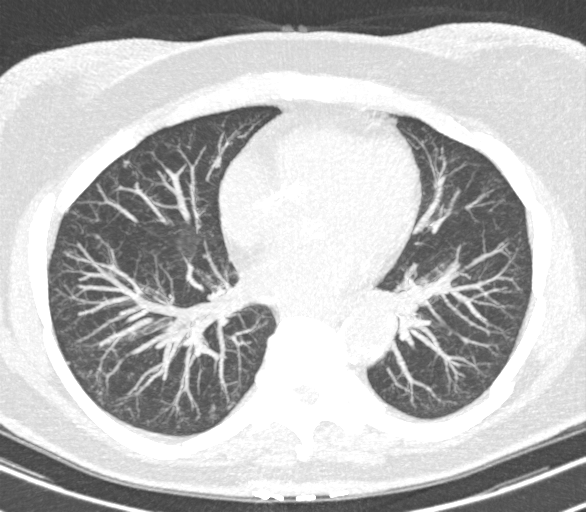
[im 23/39  lung]
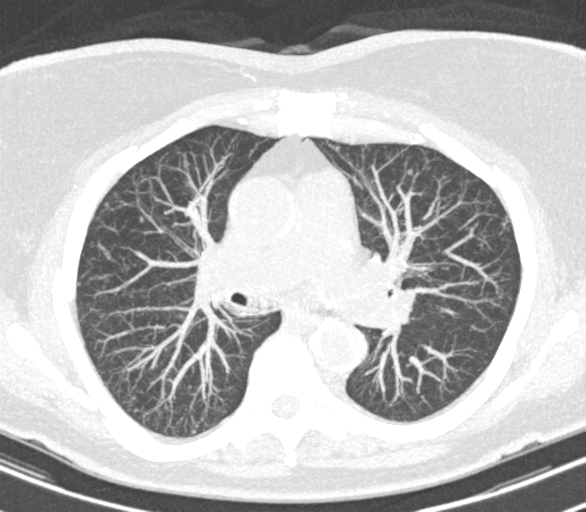
[im 31/39  lung]
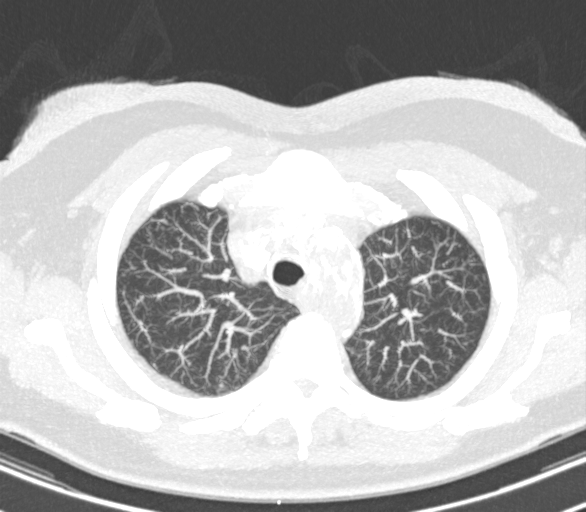

[7 of 36 positions shown; findings below may reference images not displayed]

FINDINGS: LUNGS AND PLEURA:  Few scattered noncalcified pulmonary micronodules, the 
largest of which measures 4.2 x 2.3 mm in the RAMEL (sequence 3 image 25). Mild 
paraseptal emphysema. Small foci of peripheral atelectasis. No pleural effusion. 
MEDIASTINUM:  No adenopathy. Normal heart size. No pericardial effusion. 
Atherosclerosis and marked coronary arterial calcifications. 
CHEST WALL/AXILLA: No mass or adenopathy.  
UPPER ABDOMEN: Small hiatal hernia. 
MUSCULOSKELETAL: Cervical ACDF, degenerative change and rotatory levoscoliosis.
IMPRESSION: 1.  Few scattered noncalcified pulmonary micronodules (up to 4.2 x 2.3 mm).  
2.  Mild paraseptal emphysema an d small foci of peripheral atelectasis.  
3.  Atherosclerosis and marked coronary arterial calcifications. 
4.  Small hiatal hernia. 
5.  Cervical ACDF, degenerative change and rotatory levoscoliosis. 
L-RADS: Category 2 (benign appearance, <1% chance of malignancy) 
Category 2: continue annual screening with LDCT   
RADIATION DOSE REDUCTION: All CT scans are performed using radiation dose 
reduction techniques, when applicable.  Technical factors are evaluated and 
adjusted to ensure appropriate moderation of exposure.  Automated dose 
management technology is applied to adjust the radiation doses to minimize 
exposure while achieving diagnostic quality images.

## 2023-02-13 IMAGING — MR MRI LUMBAR SPINE W/WO CONTRAST
6 of 12 series · 12 of 48 positions shown · IV contrast (Gadolinium)
Comparison: MRI thoracic spine February 13, 2023. CT chest November 11, 2021.

________________________________________________________________________________________________ 
MRI LUMBAR SPINE W/WO CONTRAST, 02/13/2023 [DATE]: 
CLINICAL INDICATION: Dorsalgia, Unspecified , mid back pain and low back pain 
radiating down both legs. No trauma.
TECHNIQUE: Multiplanar, multiecho position MR images of the lumbar spine were 
performed without and with 7.5 mL of Gadavist were injected intravenously by 
hand. Patient was scanned on a 1.5T magnet

[Series 901: survey · axial · 10.0mm · 1.25mm/px · z∈[-196,+36]mm · 2 of 10 slices shown]
[im 1/10]
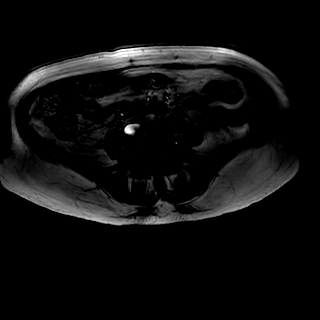
[im 10/10]
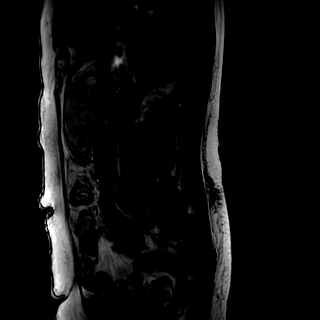

[Series 1001: t2w_cor-surv · coronal · 6.0mm · 0.62mm/px · 2 of 14 slices shown]
[im 1/14]
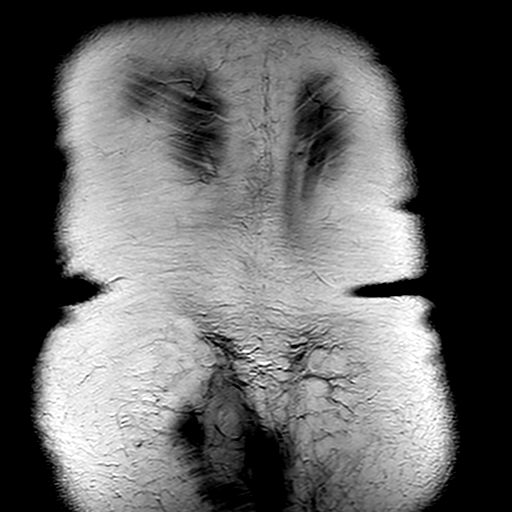
[im 14/14]
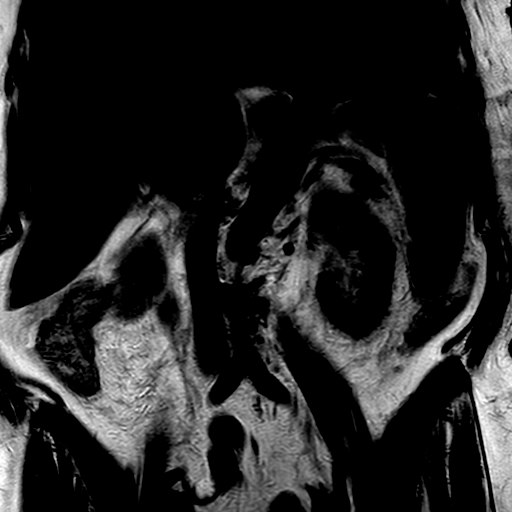

[Series 1101: t1_tse_sag · sagittal · 4.0mm · 0.34mm/px · 2 of 19 slices shown]
[im 1/19]
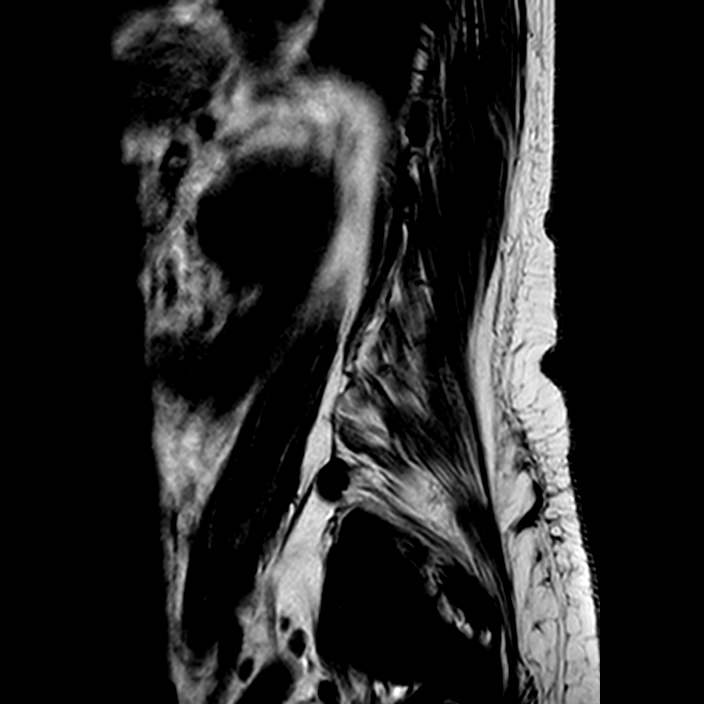
[im 19/19]
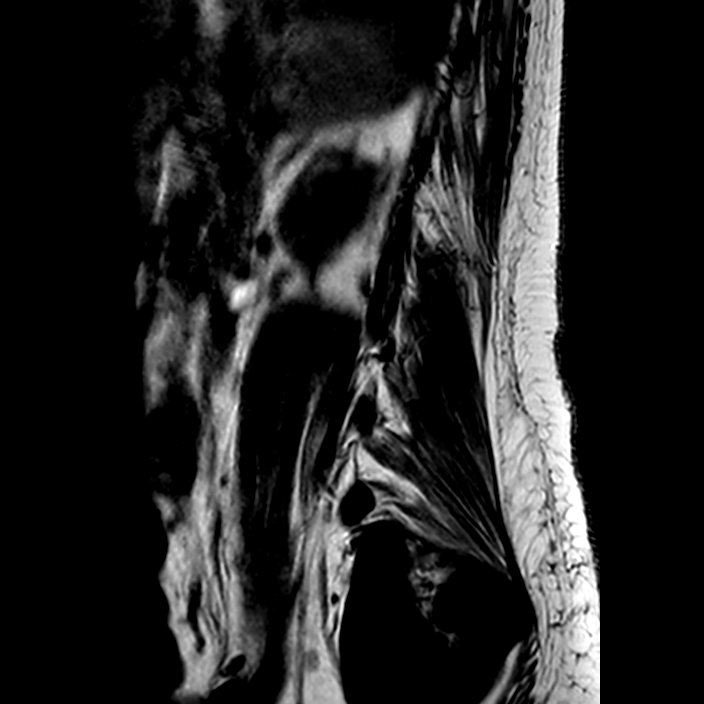

[Series 1202: (id)_mdixon_tse · sagittal · 4.0mm · 0.51mm/px · 2 of 19 slices shown]
[im 1/19]
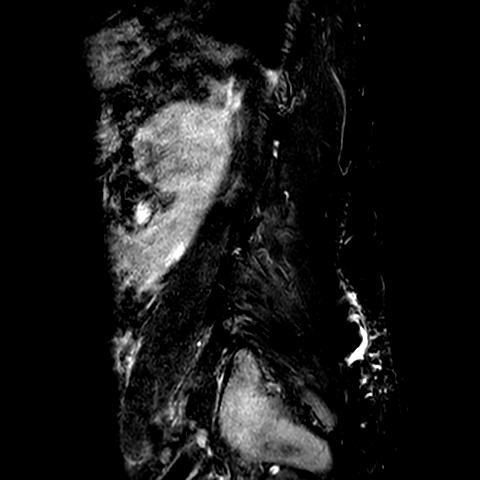
[im 19/19]
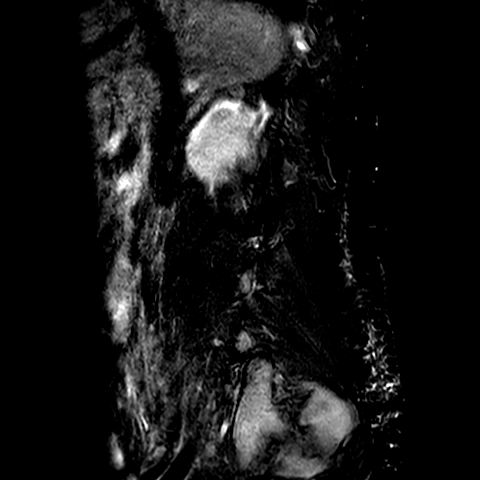

[Series 1203: st2w_mdixon_tse · sagittal · 4.0mm · 0.51mm/px · 2 of 19 slices shown]
[im 1/19]
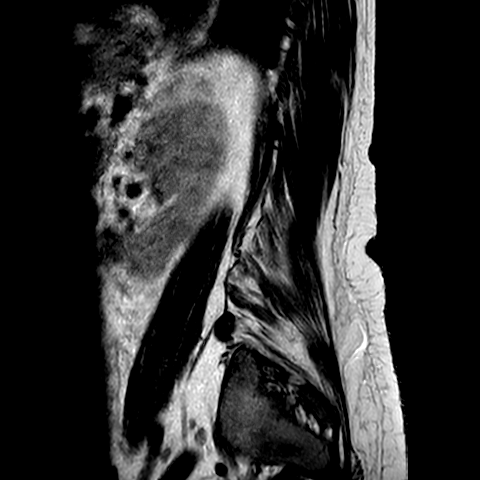
[im 19/19]
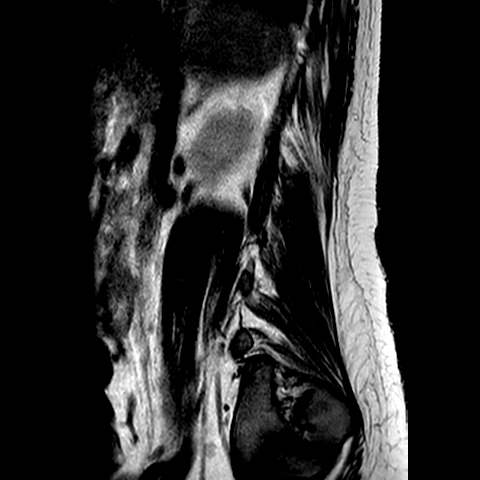

[Series 1302: (id) view_ax mpr · axial · 1.0mm · 0.25mm/px · z∈[-252,-220]mm · 2 of 112 slices shown]
[im 1/112]
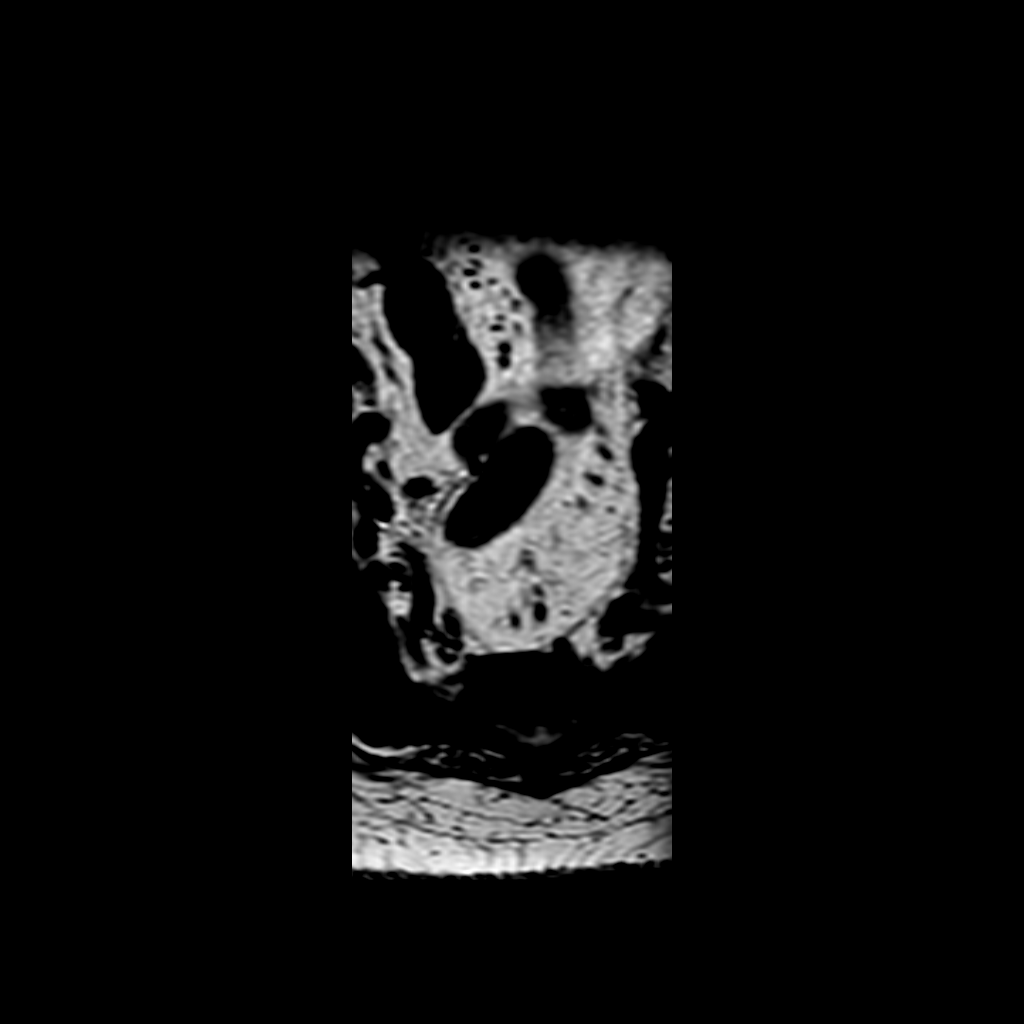
[im 21/112]
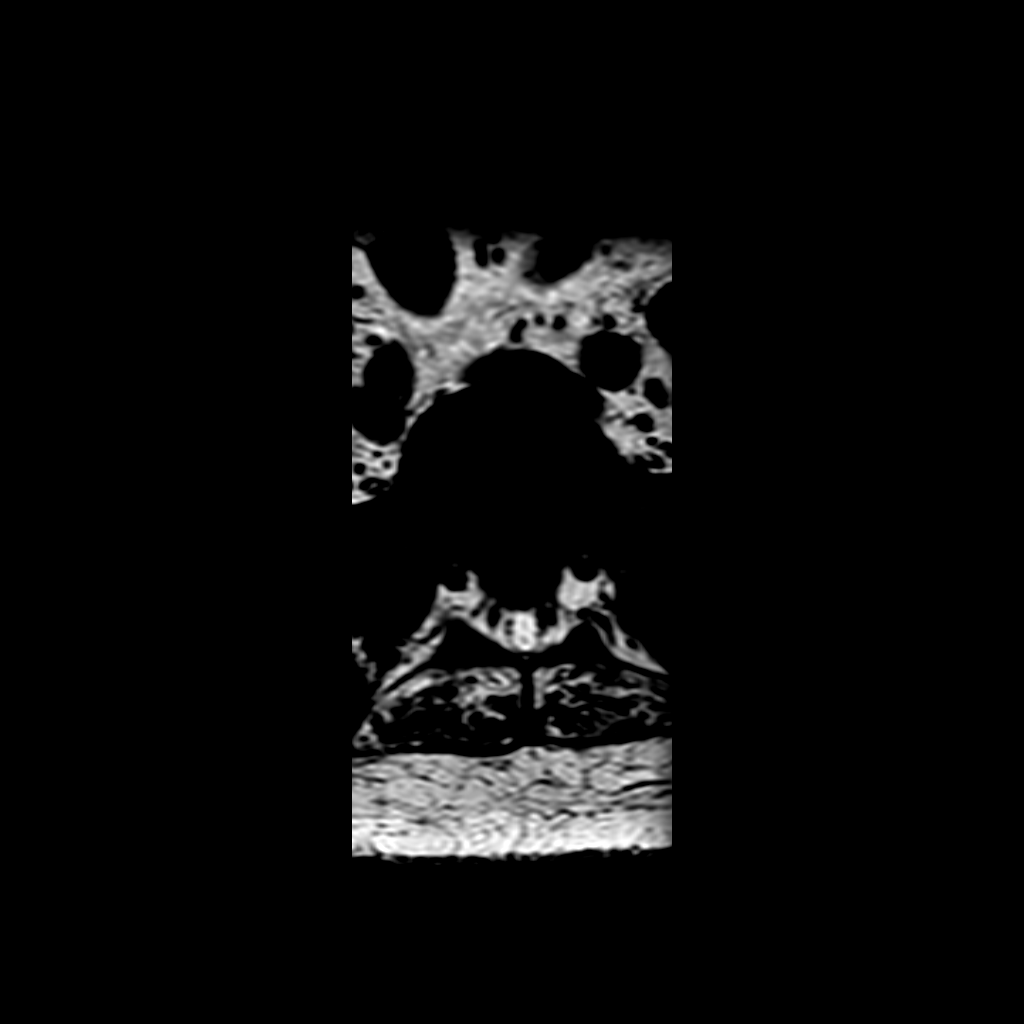

[12 of 48 positions shown; findings below may reference images not displayed]

FINDINGS: -------------------------------------------------------------------------------- 
------ 
GENERAL: 
Nomenclature is based on 5 lumbar type vertebral bodies.     
ALIGNMENT: Rotary scoliosis, levoconvex at the thoracic lumbar junction and 
dextroconvex within the lumbar spine. Grade 1 retrolisthesis L2 on L3 and L3 on 
L4. There is slight left lateral translation L2 on L3 with slight right lateral 
translation L4 on L5. 
VERTEBRAL BODY HEIGHT: Normal.  
MARROW SIGNAL: Diffuse T1 hypointense signal is present throughout the osseous 
structures which may indicate red marrow reconversion, although other marrow 
infiltrative processes not excluded. 
CORD SIGNAL: Normal distal spinal cord and cauda equina. Conus medullaris 
terminates at L1. 
ADDITIONAL FINDINGS: None. 
Modic I-II: L1-L2, L2-L3 
Ligamentum Flavum > 2.5 mm: All levels. 
-------------------------------------------------------------------------------- 
------ 
SEGMENTAL: 
T12-L1: Normal disc height and signal. No herniation. Normal facets. No spinal 
canal or neural foraminal stenosis. 
L1-L2: Loss of disc height worst to the right. Loss of disc signal. Minimal 
annular bulge. Left foramen patent. Mild right foraminal narrowing. Small right 
foraminal disc protrusion. 
L2-L3: Loss of disc height and signal with degenerative endplate irregularity. 
Mild annular bulge is slightly more pronounced to the left where there is 
moderate left lateral recess narrowing. Posterior osteophytic ridging. Facet 
arthropathy with small right facet joint effusion. Right foramen patent. Left 
foramen moderately narrowed. 
L3-L4: Loss of disc height and signal with degenerative endplate irregularity. 
Mild annular bulge. Mild lateral recess narrowing bilaterally with facet 
arthropathy. Mild right and moderate left foraminal narrowing. 
L4-L5: Loss of disc height and signal. Minimal annular bulge. Canal patent with 
facet arthropathy. Moderate left foraminal narrowing. Right foramen is patent. 
L5-S1: Loss of disc signal with minimal annular bulge. Canal and left foramen 
are patent. Mild to moderate right foraminal narrowing. Facet arthropathy with 
bilateral facet joint effusions. 5 mm right-sided facet synovial cyst projects 
within the posterior soft tissues. 
-------------------------------------------------------------------------------- 
------
IMPRESSION: Diffuse T1 hypointense signal may indicate red marrow reconversion, although 
other diffuse marrow infiltrative process is not excluded. 
Degenerative and scoliotic changes but without significant canal stenosis. 
Variable lateral recess and foraminal narrowing as above.

## 2023-02-13 IMAGING — MR MRI THORACIC SPINE W/WO CONTRAST
5 of 12 series · 19 of 48 positions shown · IV contrast (gadolinium)
Comparison: MRI lumbar spine February 13, 2023. CT lung screening November 12, 2022. CT chest November 11, 2021.

________________________________________________________________________________________________ 
******** ADDENDUM #1 ********/n 
Addendum: There is diffuse T1 hypointense signal throughout the osseous 
structures which may indicate red marrow reconversion, although other diffuse 
marrow infiltration process is not excluded. 
MRI THORACIC SPINE W/WO CONTRAST, 02/13/2023 [DATE]: 
CLINICAL INDICATION: Dorsalgia, Unspecified , mid back pain and low back pain 
radiating down both legs. No trauma.
TECHNIQUE: Multiplanar, multiecho position MR images of the thoracic spine were 
performed without and with intravenous gadolinium enhancement. 7.5 mL of 
Gadavistwere injected intravenously by hand. Patient was scanned on a
magnet.

[Series 202: T2 · sagittal · 4.0mm · 0.57mm/px · 1 of 11 slices shown (1 of 2)]
[im 1/11]
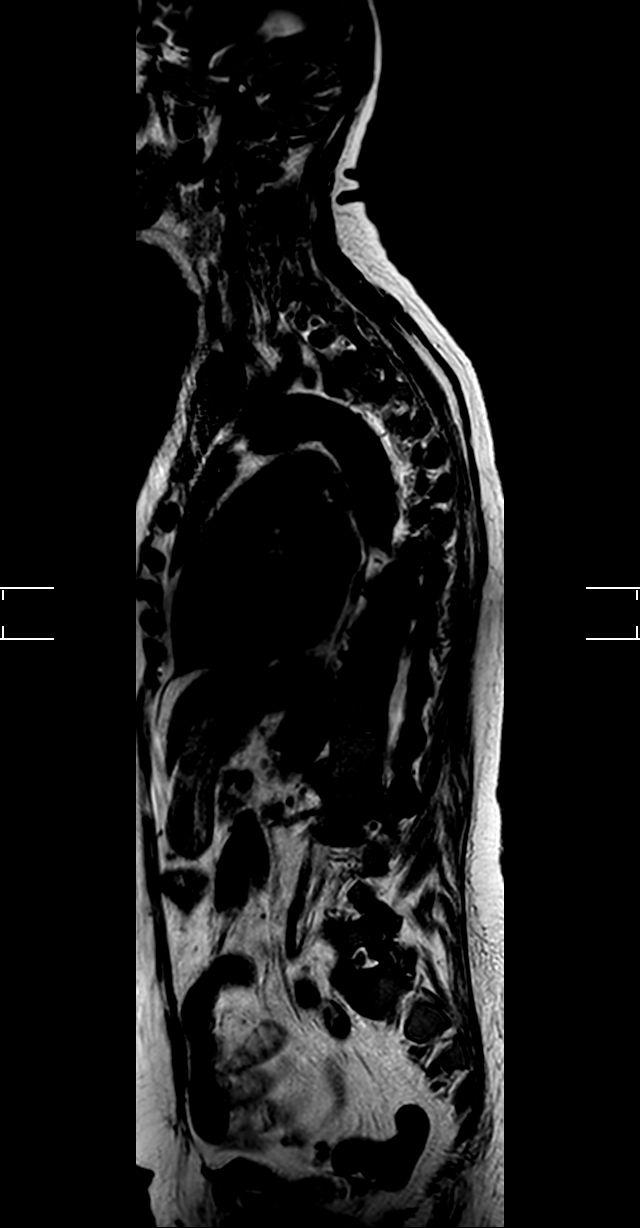

[Series 401: T1 · sagittal · 3.0mm · 0.52mm/px · 2 of 21 slices shown (1 of 2)]
[im 1/21]
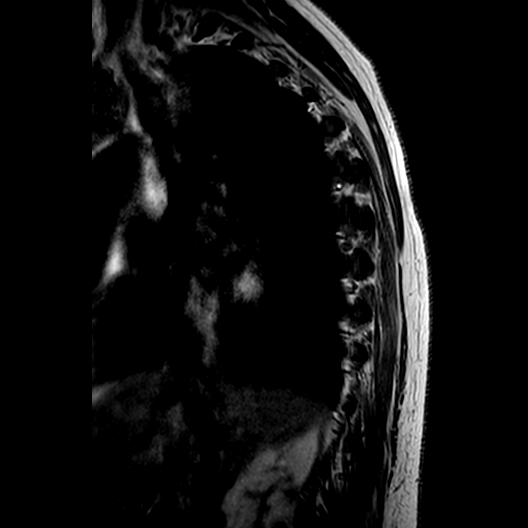
[im 21/21]
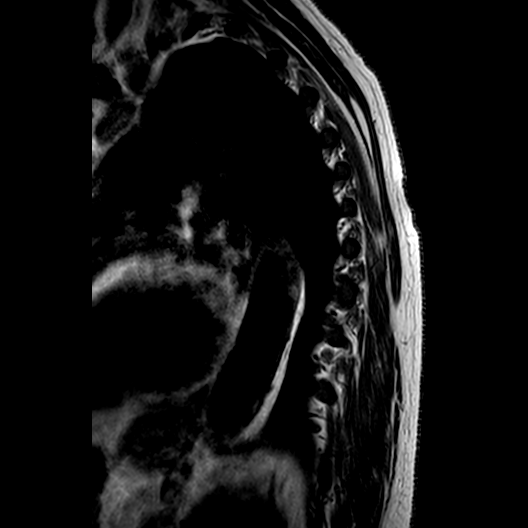

[Series 701: T2 · axial · 4.0mm · 0.42mm/px · z∈[-88,+136]mm · 7 of 71 slices shown (2 of 2)]
[im 1/71]
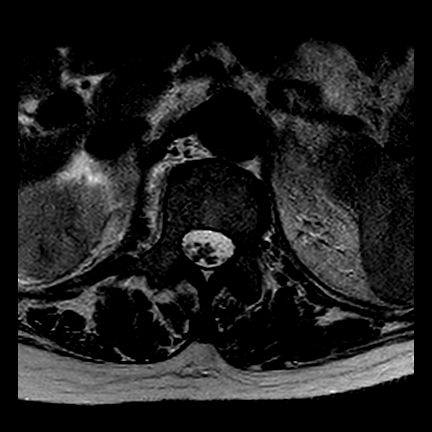
[im 12/71]
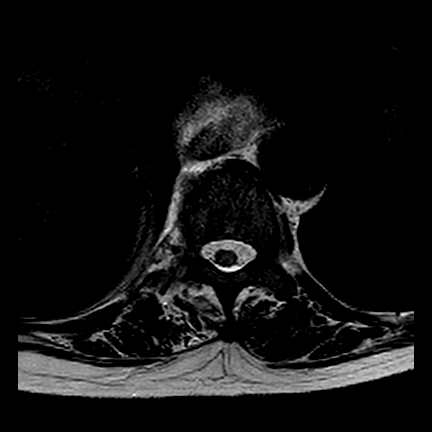
[im 24/71]
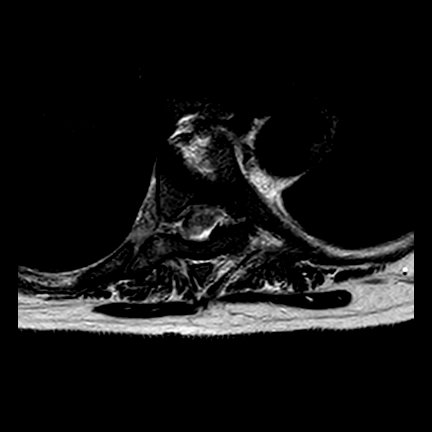
[im 36/71]
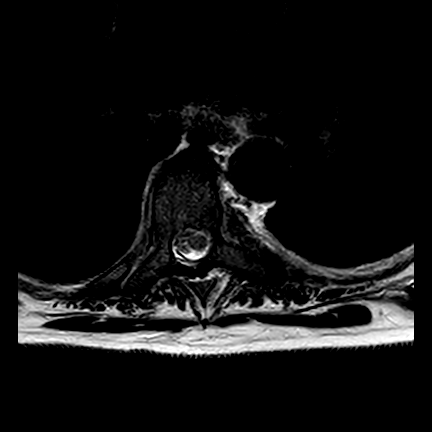
[im 47/71]
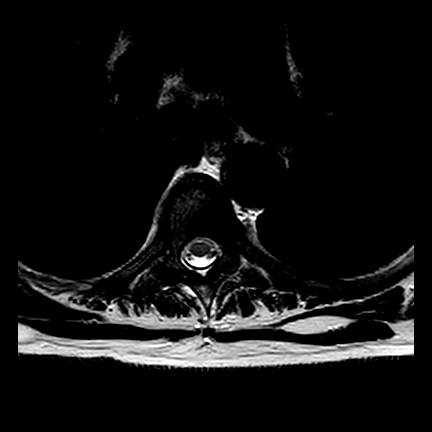
[im 59/71]
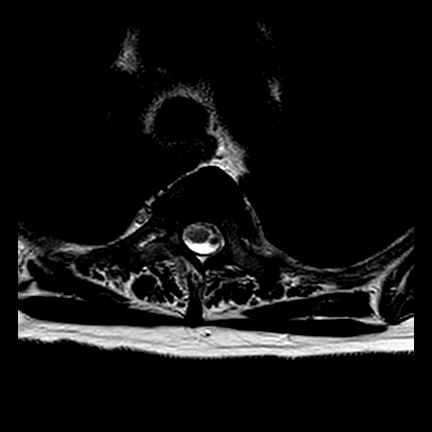
[im 71/71]
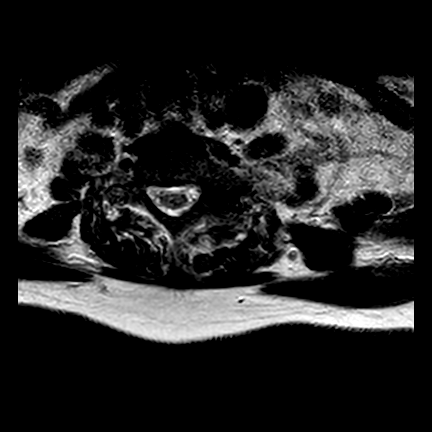

[Series 801: T1 · axial · 4.0mm · 0.38mm/px · z∈[-88,+136]mm · 7 of 71 slices shown (2 of 2)]
[im 1/71]
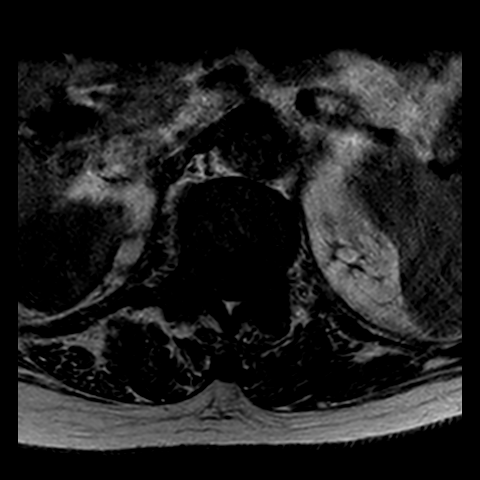
[im 12/71]
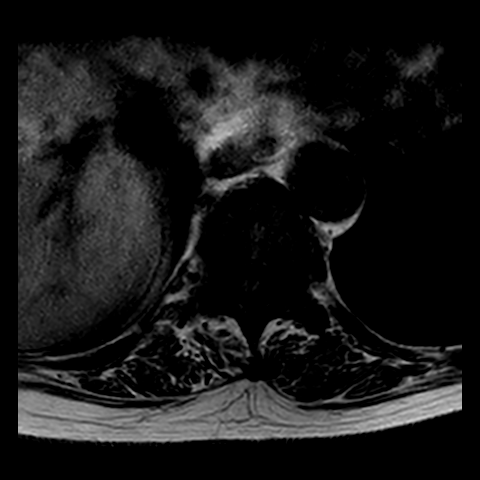
[im 24/71]
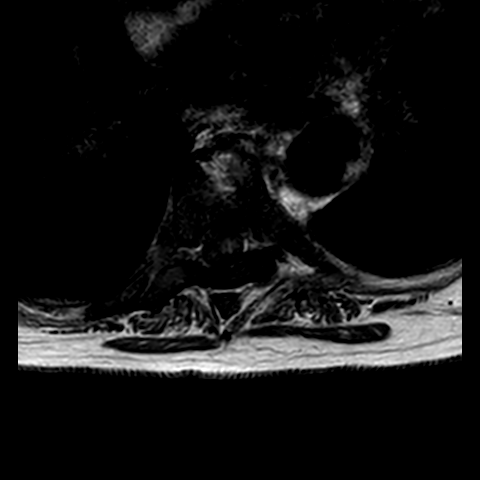
[im 36/71]
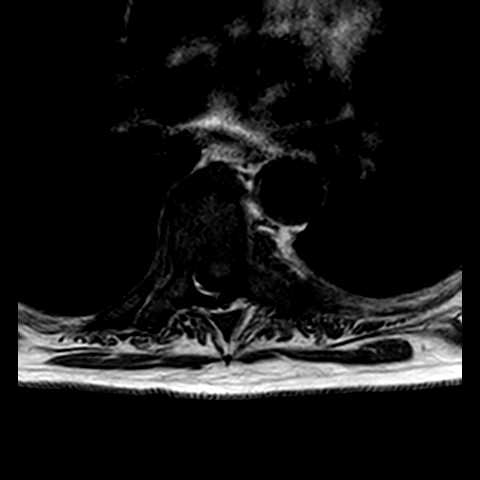
[im 47/71]
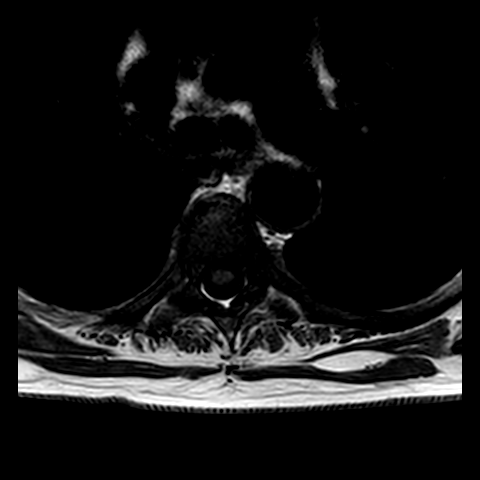
[im 59/71]
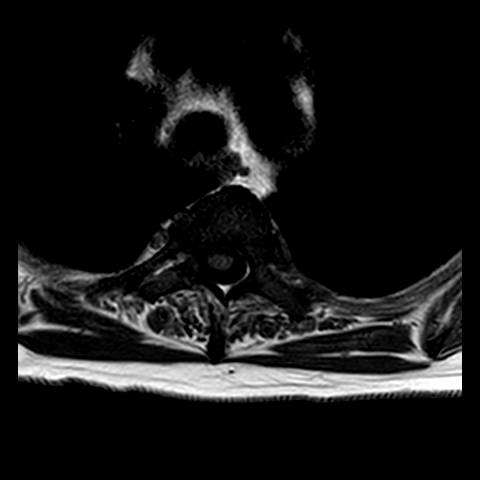
[im 71/71]
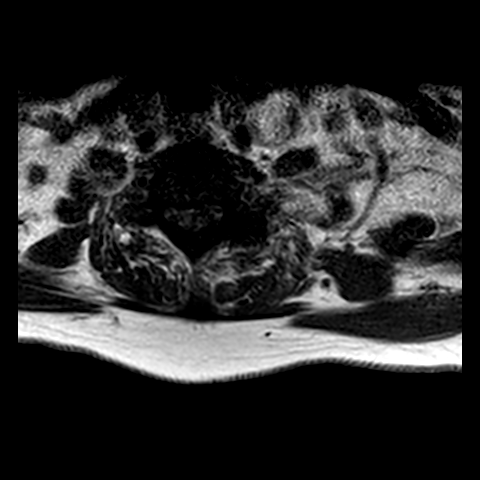

[Series 1901: T1 post-contrast · axial · 4.0mm · 0.38mm/px · z∈[-88,-40]mm · 2 of 71 slices shown]
[im 1/71]
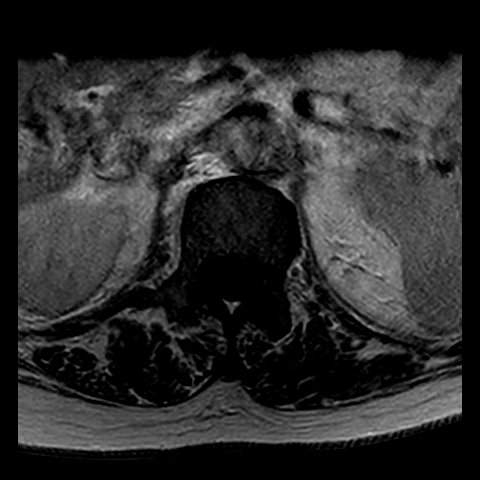
[im 12/71]
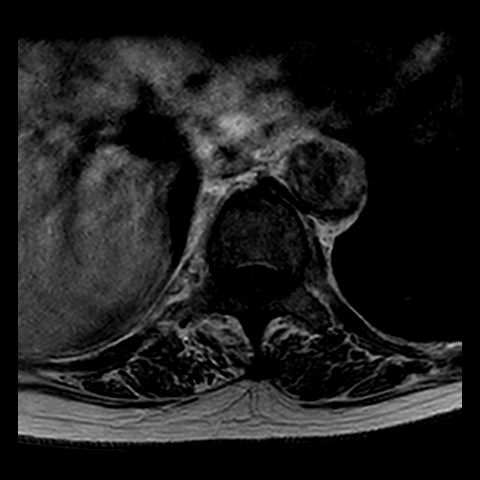

[19 of 48 positions shown; findings below may reference images not displayed]

FINDINGS: Sagittal localizer demonstrates 12 thoracic and 5 lumbar type vertebral bodies. 
ACDF C5-C6. T2 hyperintense lesions in the C2 and C3 vertebral bodies are 
suggestive of hemangiomas. 
-------------------------------------------------------------------------------- 
------ 
GENERAL: 
ALIGNMENT: 34.2 degrees of dextroconvex thoracic scoliosis. Accentuated thoracic 
kyphosis measures 44.7 degrees. Otherwise anatomic sagittal alignment. 
VERTEBRAL BODY HEIGHT: Normal.  
MARROW SIGNAL: T1 and T2 hyperintense lesion in the T8 vertebral body is 
consistent with hemangioma. Otherwise there is diffuse T1 hypointense signal 
throughout the osseous structures. While nonspecific, this may be reflective of 
red marrow reconversion. Other diffuse marrow infiltration processes not 
excluded. 
CORD SIGNAL: Normal. 
ADDITIONAL FINDINGS: None. 
-------------------------------------------------------------------------------- 
------ 
RELEVANT SEGMENTAL (levels with severe stenosis or significant findings): 
T9-T10: Moderate left foraminal narrowing. 
T10-T11: Mild to moderate right foraminal narrowing. 
Additional scattered discogenic/degenerative changes are noted. 
-------------------------------------------------------------------------------- 
------
IMPRESSION: Dextroconvex thoracic scoliosis as detailed above. 
Otherwise mild thoracic degenerative changes without significant canal stenosis. 
Variable degrees of foraminal narrowing as above.
